# Patient Record
Sex: Female | Born: 2000 | Race: Asian | Hispanic: No | Marital: Single | Smoking: Never smoker
Health system: Southern US, Community
[De-identification: ages and names within clinical notes are randomized; demographics above are authoritative.]

## PROBLEM LIST (undated history)

## (undated) DIAGNOSIS — F988 Other specified behavioral and emotional disorders with onset usually occurring in childhood and adolescence: Secondary | ICD-10-CM

## (undated) DIAGNOSIS — F32A Depression, unspecified: Secondary | ICD-10-CM

## (undated) HISTORY — DX: Depression, unspecified: F32.A

## (undated) HISTORY — DX: Other specified behavioral and emotional disorders with onset usually occurring in childhood and adolescence: F98.8

---
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
## 2019-02-08 DIAGNOSIS — Z13220 Encounter for screening for lipoid disorders: Secondary | ICD-10-CM | POA: Diagnosis not present

## 2019-02-08 DIAGNOSIS — Z1331 Encounter for screening for depression: Secondary | ICD-10-CM | POA: Diagnosis not present

## 2019-02-08 DIAGNOSIS — N946 Dysmenorrhea, unspecified: Secondary | ICD-10-CM | POA: Diagnosis not present

## 2019-02-08 DIAGNOSIS — Z713 Dietary counseling and surveillance: Secondary | ICD-10-CM | POA: Diagnosis not present

## 2019-02-08 DIAGNOSIS — N926 Irregular menstruation, unspecified: Secondary | ICD-10-CM | POA: Diagnosis not present

## 2019-02-08 DIAGNOSIS — Z68.41 Body mass index (BMI) pediatric, less than 5th percentile for age: Secondary | ICD-10-CM | POA: Diagnosis not present

## 2019-02-08 DIAGNOSIS — Z0001 Encounter for general adult medical examination with abnormal findings: Secondary | ICD-10-CM | POA: Diagnosis not present

## 2019-02-08 DIAGNOSIS — Z113 Encounter for screening for infections with a predominantly sexual mode of transmission: Secondary | ICD-10-CM | POA: Diagnosis not present

## 2019-02-14 DIAGNOSIS — Z30011 Encounter for initial prescription of contraceptive pills: Secondary | ICD-10-CM | POA: Diagnosis not present

## 2019-02-14 DIAGNOSIS — F845 Asperger's syndrome: Secondary | ICD-10-CM | POA: Insufficient documentation

## 2019-02-14 DIAGNOSIS — N926 Irregular menstruation, unspecified: Secondary | ICD-10-CM | POA: Diagnosis not present

## 2019-06-26 DIAGNOSIS — N920 Excessive and frequent menstruation with regular cycle: Secondary | ICD-10-CM | POA: Diagnosis not present

## 2019-12-05 DIAGNOSIS — Z113 Encounter for screening for infections with a predominantly sexual mode of transmission: Secondary | ICD-10-CM | POA: Diagnosis not present

## 2019-12-05 DIAGNOSIS — N926 Irregular menstruation, unspecified: Secondary | ICD-10-CM | POA: Diagnosis not present

## 2019-12-05 DIAGNOSIS — Z01419 Encounter for gynecological examination (general) (routine) without abnormal findings: Secondary | ICD-10-CM | POA: Diagnosis not present

## 2019-12-05 DIAGNOSIS — Z681 Body mass index (BMI) 19 or less, adult: Secondary | ICD-10-CM | POA: Diagnosis not present

## 2021-01-16 ENCOUNTER — Ambulatory Visit (INDEPENDENT_AMBULATORY_CARE_PROVIDER_SITE_OTHER): Payer: 59 | Admitting: Podiatry

## 2021-01-16 ENCOUNTER — Other Ambulatory Visit: Payer: Self-pay

## 2021-01-16 ENCOUNTER — Ambulatory Visit (INDEPENDENT_AMBULATORY_CARE_PROVIDER_SITE_OTHER): Payer: 59

## 2021-01-16 DIAGNOSIS — M2012 Hallux valgus (acquired), left foot: Secondary | ICD-10-CM | POA: Diagnosis not present

## 2021-01-16 DIAGNOSIS — M2011 Hallux valgus (acquired), right foot: Secondary | ICD-10-CM

## 2021-01-16 DIAGNOSIS — M357 Hypermobility syndrome: Secondary | ICD-10-CM | POA: Diagnosis not present

## 2021-01-16 DIAGNOSIS — M21621 Bunionette of right foot: Secondary | ICD-10-CM

## 2021-01-16 DIAGNOSIS — M79671 Pain in right foot: Secondary | ICD-10-CM

## 2021-01-16 DIAGNOSIS — M21622 Bunionette of left foot: Secondary | ICD-10-CM

## 2021-01-16 NOTE — Progress Notes (Signed)
  Subjective:  Patient ID: Casey Kirby, female    DOB: [birth year],  MRN: [id number]  Chief Complaint  Patient presents with   Toe Pain    Right 1st digit painful, injured in may 2022   Bunions    Discuss bunion correction surgery    20 y.o. female presents with the above complaint. History confirmed with patient. Has pain with all of her shoes at the big toe, with pain at the little toe area with some shoes. Has had more consistent pain since she was seen for this issue in 2018. Objective:  Physical Exam: warm, good capillary refill, no trophic changes or ulcerative lesions, normal DP and PT pulses, and normal sensory exam. Hallux valgus bilat  with gross 1st TMT hypermobility bilat. Tailor's bunion bilat. POP 1st MPJ with prominent medial eminence. POP 5th MPJ. 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  No images are attached to the encounter.  Radiographs: X-ray of both feet: no fracture, dislocation, swelling or degenerative changes noted and severe HAV deformity bilat, tailor's bunionecctomy bilat with bowing of 5th metatarsal bilat Assessment:   1. Acquired hallux valgus of both feet   2. Foot joint hypermobility   3. Tailor's bunion of both feet    Plan:  Patient was evaluated and treated and all questions answered.  Bunion -XR reviewed with patient -Educated on etiology of deformity -Discussed proper shoe gear modifications and padding -Patient has failed all conservative therapy and wishes to proceed with surgical intervention. All risks, benefits, and alternatives discussed with patient. No guarantees given. Consent reviewed and signed by patient. -Planned procedures: right lapidus bunionectomy, tailor's bunionectomy -ASA 1 - Normal health patient  -Post-op anticoagulation: chemoprophylaxis not indicated -DME dispensed for post-op use: none  No follow-ups on file.

## 2021-01-21 ENCOUNTER — Other Ambulatory Visit: Payer: Self-pay | Admitting: Podiatry

## 2021-01-21 DIAGNOSIS — M2011 Hallux valgus (acquired), right foot: Secondary | ICD-10-CM

## 2021-02-04 ENCOUNTER — Telehealth: Payer: Self-pay | Admitting: Urology

## 2021-02-04 NOTE — Telephone Encounter (Signed)
DOS - 02/18/21  LAPIDUS PROC. INCLUDING BUNIONECTOMY RIGHT --- 28297 METATARSAL OSTEO 5TH RIGHT --- [phone]   [facility] EFFECTIVE DATE - 07/12/20   PLAN DEDUCTIBLE - $500.00 W/ $500.00 REMAINING OUT OF POCKET - $3,000.00 W/ $2,970.00 REMAINING COINSURANCE - 15% COPAY - $0.00    PER [facility] SITE CPT CODES 90903 AND 28308 HAVE BEEN APPROVED, AUTH # [id number].

## 2021-02-18 ENCOUNTER — Other Ambulatory Visit: Payer: Self-pay | Admitting: Podiatry

## 2021-02-18 ENCOUNTER — Encounter: Payer: Self-pay | Admitting: Podiatry

## 2021-02-18 DIAGNOSIS — M2011 Hallux valgus (acquired), right foot: Secondary | ICD-10-CM | POA: Diagnosis not present

## 2021-02-18 HISTORY — PX: OTHER SURGICAL HISTORY: SHX169

## 2021-02-18 MED ORDER — CEPHALEXIN 500 MG PO CAPS: ORAL_CAPSULE | ORAL | 0 refills | Status: DC

## 2021-02-18 MED ORDER — OXYCODONE-ACETAMINOPHEN 5-325 MG PO TABS: 1.0000 | ORAL_TABLET | ORAL | 0 refills | Status: DC | PRN

## 2021-02-19 ENCOUNTER — Telehealth: Payer: Self-pay | Admitting: Podiatry

## 2021-02-19 MED ORDER — OXYCODONE-ACETAMINOPHEN 10-325 MG PO TABS: 1.0000 | ORAL_TABLET | ORAL | 0 refills | Status: DC | PRN

## 2021-02-19 NOTE — Telephone Encounter (Signed)
Patients mom calling to inform Dr Samuella Cota that patient is not responding to pain medication. She called the anesthesiologist and they told her she would need to call the office to either have the medication changed or up the dosage. Please advise.

## 2021-02-19 NOTE — Telephone Encounter (Signed)
Patients mother called stating she had surgery yesterday and her pain medication is not working and wants to change the prescription.

## 2021-02-19 NOTE — Telephone Encounter (Signed)
Called mother and advised on pain med regimen including new rx, advised icing, elevating

## 2021-02-19 NOTE — Addendum Note (Signed)
Addended by: Ventura Sellers on: 02/19/2021 11:16 AM   Modules accepted: Orders

## 2021-02-24 ENCOUNTER — Ambulatory Visit (INDEPENDENT_AMBULATORY_CARE_PROVIDER_SITE_OTHER): Payer: 59

## 2021-02-24 ENCOUNTER — Other Ambulatory Visit: Payer: Self-pay

## 2021-02-24 ENCOUNTER — Ambulatory Visit (INDEPENDENT_AMBULATORY_CARE_PROVIDER_SITE_OTHER): Payer: 59 | Admitting: Podiatry

## 2021-02-24 DIAGNOSIS — Z9889 Other specified postprocedural states: Secondary | ICD-10-CM

## 2021-02-24 MED ORDER — ONDANSETRON HCL 4 MG PO TABS: 4.0000 mg | ORAL_TABLET | Freq: Three times a day (TID) | ORAL | 0 refills | Status: DC | PRN

## 2021-02-24 NOTE — Progress Notes (Signed)
  Subjective:  Patient ID: Casey Kirby, female    DOB: [birth year],  MRN: [id number]  Chief Complaint  Patient presents with   Routine Post Op    POV #1 DOS 02/18/2021 RT FOOT CORRECTION OF BUNION W/TARSOMETATARSAL FUSION, CORRECTION OF TAILORS BUNION. Pt states she is having a lot of pain with nausea.     DOS: 02/18/21 Procedure: Lapidus bunioctomy right, tailor's bunionectomy right   20 y.o. female presents with the above complaint. History confirmed with patient. Having a lot of nausea and pain post-operatively. States she is trying not to take the pain medication.  Objective:  Physical Exam: tenderness at the surgical site, local edema noted, and calf supple, nontender. Incision: healing well, no significant drainage, no dehiscence, no significant erythema  No images are attached to the encounter.  Radiographs: X-ray of the right foot: consistent with post-op state, with good alignment and no evidence of hardware complication.    Assessment:   1. Post-operative state     Plan:  Patient was evaluated and treated and all questions answered.  Post-operative State -XR reviewed with patient -Dressing applied consisting of sterile gauze, kerlix, and ACE bandage -NWB with crutches. -Pain has been difficult to control. Encouraged pain medication as needed. Rx Zofran for post-op and pain medication related nausea. -XRs needed at follow-up: none  Return in about 1 week (around 03/03/2021) for Post-Op (No XRs).

## 2021-03-03 ENCOUNTER — Ambulatory Visit (INDEPENDENT_AMBULATORY_CARE_PROVIDER_SITE_OTHER): Payer: 59 | Admitting: Podiatry

## 2021-03-03 ENCOUNTER — Other Ambulatory Visit: Payer: Self-pay

## 2021-03-03 DIAGNOSIS — Z9889 Other specified postprocedural states: Secondary | ICD-10-CM

## 2021-03-03 DIAGNOSIS — M2012 Hallux valgus (acquired), left foot: Secondary | ICD-10-CM

## 2021-03-03 DIAGNOSIS — M2011 Hallux valgus (acquired), right foot: Secondary | ICD-10-CM

## 2021-03-03 NOTE — Progress Notes (Signed)
  Subjective:  Patient ID: Casey Kirby, female    DOB: 26-Jan-2001,  MRN: [id number]  Chief Complaint  Patient presents with   Routine Post Op    PT stated that she is doing okay she does have some pain and itching    DOS: 02/18/21 Procedure: Lapidus bunioctomy right, tailor's bunionectomy right   20 y.o. female presents with the above complaint. History confirmed with patient. Pain much bette controlled at this point more itching than pain.  Objective:  Physical Exam: tenderness at the surgical site, local edema noted, and calf supple, nontender. Incision: healing well, no significant drainage, no dehiscence, no significant erythema  Assessment:   1. Post-operative state   2. Acquired hallux valgus of both feet    Plan:  Patient was evaluated and treated and all questions answered.  Post-operative State -Steri-strips applied to the incision -Ok to start showering at this time. Advised they cannot soak. -NWB with crutches. -Pain much better today. No need for pain refill per patient. -XRs needed at follow-up: 3 view Foot  Return in about 2 weeks (around 03/17/2021) for Post-Op (with XRs).

## 2021-03-03 NOTE — Addendum Note (Signed)
Addended by: Ventura Sellers on: 03/03/2021 04:51 PM   Modules accepted: Orders

## 2021-03-10 ENCOUNTER — Encounter: Payer: 59 | Admitting: Podiatry

## 2021-03-17 ENCOUNTER — Other Ambulatory Visit: Payer: Self-pay

## 2021-03-17 ENCOUNTER — Ambulatory Visit (INDEPENDENT_AMBULATORY_CARE_PROVIDER_SITE_OTHER): Payer: 59 | Admitting: Podiatry

## 2021-03-17 ENCOUNTER — Ambulatory Visit (INDEPENDENT_AMBULATORY_CARE_PROVIDER_SITE_OTHER): Payer: 59

## 2021-03-17 DIAGNOSIS — M21621 Bunionette of right foot: Secondary | ICD-10-CM

## 2021-03-17 DIAGNOSIS — M2011 Hallux valgus (acquired), right foot: Secondary | ICD-10-CM

## 2021-03-17 DIAGNOSIS — M2012 Hallux valgus (acquired), left foot: Secondary | ICD-10-CM

## 2021-03-17 DIAGNOSIS — Z9889 Other specified postprocedural states: Secondary | ICD-10-CM

## 2021-03-17 DIAGNOSIS — M21622 Bunionette of left foot: Secondary | ICD-10-CM

## 2021-03-23 NOTE — Progress Notes (Signed)
  Subjective:  Patient ID: Casey Kirby, female    DOB: February 16, 2001,  MRN: [id number]  Chief Complaint  Patient presents with   Routine Post Op    POV #3 DOS 02/18/2021 RT FOOT CORRECTION OF BUNION W/TARSOMETATARSAL FUSION, CORRECTION OF TAILORS BUNION   DOS: 02/18/21 Procedure: Lapidus bunioctomy right, tailor's bunionectomy right   19 y.o. female presents with the above complaint. History confirmed with patient.  Has occasional sharp pains in complaint that there is a small area of incision that she thinks might be open.  Objective:  Physical Exam: tenderness at the surgical site, local edema noted, and calf supple, nontender. Incision: Healing well but for small 0.3 x 0.1 area of delayed healing  Assessment:   1. Post-operative state   2. Acquired hallux valgus of both feet   3. Tailor's bunion of both feet    Plan:  Patient was evaluated and treated and all questions answered.  Post-operative State -Repeat x-rays show progressive bridging of the arthrodesis site and 5th met osteotomy -Okay to start protected weightbearing in the boot. -Apply povidone Telfa and dry dressing today.  Patient to apply povidone and gauze tape daily for continued small area of delayed healing. -No need for pain medication refill today -XRs needed at follow-up: 3 view Foot  No follow-ups on file.

## 2021-03-31 ENCOUNTER — Ambulatory Visit (INDEPENDENT_AMBULATORY_CARE_PROVIDER_SITE_OTHER): Payer: 59 | Admitting: Podiatry

## 2021-03-31 ENCOUNTER — Ambulatory Visit (INDEPENDENT_AMBULATORY_CARE_PROVIDER_SITE_OTHER): Payer: 59

## 2021-03-31 ENCOUNTER — Other Ambulatory Visit: Payer: Self-pay

## 2021-03-31 DIAGNOSIS — Z9889 Other specified postprocedural states: Secondary | ICD-10-CM

## 2021-03-31 DIAGNOSIS — M2012 Hallux valgus (acquired), left foot: Secondary | ICD-10-CM | POA: Diagnosis not present

## 2021-03-31 DIAGNOSIS — M2011 Hallux valgus (acquired), right foot: Secondary | ICD-10-CM

## 2021-03-31 NOTE — Progress Notes (Signed)
  Subjective:  Patient ID: Casey Kirby, female    DOB: [birth year],  MRN: [id number]  Chief Complaint  Patient presents with   Routine Post Op     POV #4 DOS 02/18/2021 RT FOOT CORRECTION OF BUNION W/TARSOMETATARSAL FUSION, CORRECTION OF TAILORS BUNION   DOS: 02/18/21 Procedure: Lapidus bunioctomy right, tailor's bunionectomy right   20 y.o. female presents with the above complaint. History confirmed with patient. Doing well not having much pain or discomfort. Has been wearing the boot without issues.  Objective:  Physical Exam: tenderness at the surgical site, local edema noted, and calf supple, nontender. Incision: Healing well, fully healed with dried dermabond noted around the incisions.  Assessment:   1. Acquired hallux valgus of both feet   2. Post-operative state    Plan:  Patient was evaluated and treated and all questions answered.  Post-operative State -Repeat x-rays show healing of the arthrodesis and osteotomy. Progress WB to surgical shoe. This was dispensed today. Wear for 2 weeks, then normal shoegear. -F/u in 1 month for recheck -XRs needed at follow-up: 3 view Foot  Return in about 1 month (around 04/30/2021).

## 2021-05-05 ENCOUNTER — Ambulatory Visit (INDEPENDENT_AMBULATORY_CARE_PROVIDER_SITE_OTHER): Payer: 59 | Admitting: Podiatry

## 2021-05-05 ENCOUNTER — Other Ambulatory Visit: Payer: Self-pay

## 2021-05-05 ENCOUNTER — Ambulatory Visit (INDEPENDENT_AMBULATORY_CARE_PROVIDER_SITE_OTHER): Payer: 59

## 2021-05-05 DIAGNOSIS — Z9889 Other specified postprocedural states: Secondary | ICD-10-CM | POA: Diagnosis not present

## 2021-05-05 NOTE — Progress Notes (Signed)
  Subjective:  Patient ID: Casey Kirby, female    DOB: 2000/12/19,  MRN: [id number]  Chief Complaint  Patient presents with   Routine Post Op    POV #5 DOS 02/18/2021 RT FOOT CORRECTION OF BUNION W/TARSOMETATARSAL FUSION, CORRECTION OF TAILORS BUNION. Pt states she is gradually improving. Not quite 100%.    DOS: 02/18/21 Procedure: Lapidus bunioctomy right, tailor's bunionectomy right   20 y.o. female presents with the above complaint. History confirmed with patient. Doing very well states she is able to wear most shoes but some are still not comfortable.  Objective:  Physical Exam: no tenderness at the surgical site, no edema noted, and calf supple, nontender. Decreased ROM 1st MPJ, reducible on manual stretch Incision: fully healed  Assessment:   1. Post-operative state    Plan:  Patient was evaluated and treated and all questions answered.  Post-operative State -Repeat x-rays show full healing of the arthrodesis and osteotomy.  -Continue WBAT in normal shoegear -F/u in 1 month for recheck -XRs needed at follow-up: none  No follow-ups on file.

## 2021-06-02 ENCOUNTER — Other Ambulatory Visit: Payer: Self-pay

## 2021-06-02 ENCOUNTER — Ambulatory Visit (INDEPENDENT_AMBULATORY_CARE_PROVIDER_SITE_OTHER): Payer: 59 | Admitting: Podiatry

## 2021-06-02 ENCOUNTER — Ambulatory Visit: Payer: Self-pay | Admitting: Podiatry

## 2021-06-02 DIAGNOSIS — M2011 Hallux valgus (acquired), right foot: Secondary | ICD-10-CM

## 2021-06-02 DIAGNOSIS — M357 Hypermobility syndrome: Secondary | ICD-10-CM

## 2021-06-02 DIAGNOSIS — M21621 Bunionette of right foot: Secondary | ICD-10-CM

## 2021-06-02 DIAGNOSIS — M2012 Hallux valgus (acquired), left foot: Secondary | ICD-10-CM

## 2021-06-02 DIAGNOSIS — M21622 Bunionette of left foot: Secondary | ICD-10-CM

## 2021-06-02 NOTE — Progress Notes (Signed)
  Subjective:  Patient ID: Casey Kirby, female    DOB: 05-26-2001,  MRN: [id number]  Chief Complaint  Patient presents with   Routine Post Op     POV #6 DOS 02/18/2021 RT FOOT CORRECTION OF BUNION W/TARSOMETATARSAL FUSION, CORRECTION OF TAILORS BUNION. PT states she has been doing well. No questions or concerns.    DOS: 02/18/21 Procedure: Lapidus bunioctomy right, tailor's bunionectomy right   20 y.o. female presents with the above complaint. History confirmed with patient. Denies pain or issues right foot but having pain and inability to wear certain shoes on her left foot. She would like to proceed with surgery as previously discussed.  Objective:  Physical Exam: no tenderness at the surgical site, no edema noted, and calf supple, nontender. Near full ROM 1st MPJ, Incision: fully healed]  Severe Left foot HAV, with 1st TMT gross hypermobility. POP 1st/5th MPJs.  Radiographs 7/8: X-ray of left foot: no fracture, dislocation, swelling or degenerative changes noted and severe HAV deformity , tailor's bunionecctomy with bowing of 5th metatarsal bilat Assessment:   1. Acquired hallux valgus of both feet   2. Tailor's bunion of both feet   3. Foot joint hypermobility    Plan:  Patient was evaluated and treated and all questions answered.  Post-operative State -Well healed no pain good positioning and good ROM  Bunion -Previous XR reviewed. Severe HAV, tailor's bunion deformity. -Given pain and difficulty with shoegear on the left side (and right side now resolved post-operatively), will proceed with left foot surgery as previously discussed. -Patient has failed conservative therapy and wishes to proceed with surgical intervention. All risks, benefits, and alternatives discussed with patient. No guarantees given. Consent reviewed and signed by patient. -Planned procedures: Lapidus bunionectomy, tailor's bunionectomy with osteotomy, possible Akin osteotomy   No follow-ups on file.

## 2021-06-10 ENCOUNTER — Telehealth: Payer: Self-pay | Admitting: Urology

## 2021-06-10 NOTE — Telephone Encounter (Signed)
DOS - 06/17/21  Quintella Reichert OSTEOTOMY LEFT --- 41937 LAPIDUS PROCEDURE INCLUDING BUNIONECTOMY LEFT --- 28297 METATARSAL OSTEOTOMY 5TH LEFT --- 28308   [facility] EFFECTIVE DATE - 07/12/20  RECEIVED FAX FROM [facility] STATING THAT CPT CODES 90240, 97353 AND 28308 HAVE BEEN APPROVED, AUTH # [id number], GOOD FROM 06/17/21 - 09/15/21.

## 2021-06-17 ENCOUNTER — Encounter (HOSPITAL_BASED_OUTPATIENT_CLINIC_OR_DEPARTMENT_OTHER): Payer: Self-pay | Admitting: Podiatry

## 2021-06-17 NOTE — Progress Notes (Signed)
Spoke with shelley and patient is having surgery today at another location this surgery to be cancelled

## 2021-06-19 ENCOUNTER — Ambulatory Visit (HOSPITAL_BASED_OUTPATIENT_CLINIC_OR_DEPARTMENT_OTHER): Admission: RE | Admit: 2021-06-19 | Payer: 59 | Source: Home / Self Care | Admitting: Podiatry

## 2021-06-19 SURGERY — BUNIONECTOMY, LAPIDUS
Anesthesia: General | Laterality: Left

## 2021-06-22 ENCOUNTER — Telehealth: Payer: Self-pay | Admitting: Podiatry

## 2021-06-22 NOTE — Telephone Encounter (Signed)
Patient called and stated that her sx was cancelled and asked that her post op be cancelled.

## 2021-06-23 ENCOUNTER — Encounter: Payer: 59 | Admitting: Podiatry

## 2021-07-07 ENCOUNTER — Encounter: Payer: 59 | Admitting: Podiatry

## 2021-08-20 ENCOUNTER — Ambulatory Visit: Payer: 59 | Admitting: Registered Nurse

## 2021-09-03 ENCOUNTER — Ambulatory Visit: Payer: 59 | Admitting: Registered Nurse

## 2021-09-03 ENCOUNTER — Encounter: Payer: Self-pay | Admitting: Registered Nurse

## 2021-09-03 ENCOUNTER — Other Ambulatory Visit: Payer: Self-pay

## 2021-09-03 ENCOUNTER — Other Ambulatory Visit (HOSPITAL_COMMUNITY)
Admission: RE | Admit: 2021-09-03 | Discharge: 2021-09-03 | Disposition: A | Discharge status: Home / Self Care | Payer: 59 | Source: Ambulatory Visit | Attending: Registered Nurse | Admitting: Registered Nurse

## 2021-09-03 VITALS — BP 98/79 | HR 81 | Temp 97.8°F | Resp 16 | Ht 66.0 in | Wt 101.8 lb

## 2021-09-03 DIAGNOSIS — H8110 Benign paroxysmal vertigo, unspecified ear: Secondary | ICD-10-CM

## 2021-09-03 DIAGNOSIS — Z1159 Encounter for screening for other viral diseases: Secondary | ICD-10-CM | POA: Diagnosis not present

## 2021-09-03 DIAGNOSIS — Z789 Other specified health status: Secondary | ICD-10-CM | POA: Insufficient documentation

## 2021-09-03 DIAGNOSIS — R1909 Other intra-abdominal and pelvic swelling, mass and lump: Secondary | ICD-10-CM

## 2021-09-03 DIAGNOSIS — Z5320 Procedure and treatment not carried out because of patient's decision for unspecified reasons: Secondary | ICD-10-CM

## 2021-09-03 DIAGNOSIS — R55 Syncope and collapse: Secondary | ICD-10-CM

## 2021-09-03 LAB — CBC WITH DIFFERENTIAL/PLATELET
Basophils Absolute: 0 10*3/uL (ref 0.0–0.1)
Basophils Relative: 0.6 % (ref 0.0–3.0)
Eosinophils Absolute: 0.1 10*3/uL (ref 0.0–0.7)
Eosinophils Relative: 2 % (ref 0.0–5.0)
HCT: 43 % (ref 36.0–46.0)
Hemoglobin: 14 g/dL (ref 12.0–15.0)
Lymphocytes Relative: 43 % (ref 12.0–46.0)
Lymphs Abs: 1.8 10*3/uL (ref 0.7–4.0)
MCHC: 32.5 g/dL (ref 30.0–36.0)
MCV: 91.7 fl (ref 78.0–100.0)
Monocytes Absolute: 0.2 10*3/uL (ref 0.1–1.0)
Monocytes Relative: 5.4 % (ref 3.0–12.0)
Neutro Abs: 2 10*3/uL (ref 1.4–7.7)
Neutrophils Relative %: 49 % (ref 43.0–77.0)
Platelets: 218 10*3/uL (ref 150.0–400.0)
RBC: 4.69 Mil/uL (ref 3.87–5.11)
RDW: 12.5 % (ref 11.5–15.5)
WBC: 4.1 10*3/uL (ref 4.0–10.5)

## 2021-09-03 LAB — TSH: TSH: 0.59 u[IU]/mL (ref 0.35–5.50)

## 2021-09-03 LAB — COMPREHENSIVE METABOLIC PANEL
ALT: 7 U/L (ref 0–35)
AST: 12 U/L (ref 0–37)
Albumin: 4.7 g/dL (ref 3.5–5.2)
Alkaline Phosphatase: 42 U/L (ref 39–117)
BUN: 10 mg/dL (ref 6–23)
CO2: 28 mEq/L (ref 19–32)
Calcium: 9.5 mg/dL (ref 8.4–10.5)
Chloride: 104 mEq/L (ref 96–112)
Creatinine, Ser: 0.67 mg/dL (ref 0.40–1.20)
GFR: 124.67 mL/min (ref >60.00)
Glucose, Bld: 79 mg/dL (ref 70–99)
Potassium: 3.8 mEq/L (ref 3.5–5.1)
Sodium: 139 mEq/L (ref 135–145)
Total Bilirubin: 0.9 mg/dL (ref 0.2–1.2)
Total Protein: 7.1 g/dL (ref 6.0–8.3)

## 2021-09-03 LAB — POCT URINE PREGNANCY: Preg Test, Ur: NEGATIVE

## 2021-09-03 MED ORDER — MECLIZINE HCL 12.5 MG PO TABS: 12.5000 mg | ORAL_TABLET | Freq: Three times a day (TID) | ORAL | 0 refills | Status: DC | PRN

## 2021-09-03 MED ORDER — XULANE 150-35 MCG/24HR TD PTWK: MEDICATED_PATCH | TRANSDERMAL | 4 refills | Status: DC

## 2021-09-03 NOTE — Patient Instructions (Addendum)
Ms. Casey Kirby to meet you!  I have placed a referral to Gynecology. They will call you to set up an appointment. We'll make sure the office we send you to will take your insurance.  I have ordered labs today. I will let you know how results look. If you sign up for MyChart, you will be able to see the results there.   I think it's likely that dehydration and low blood sugars are the largest contributors to how you are feeling. I recommend making sure you snack throughout the [place] and drink at least 2L of water daily.   Come back in for a physical and repeat blood work (if needed) within a year or so.  Thank you,  Rich     If you have lab work done today you will be contacted with your lab results within the next 2 weeks.  If you have not heard from [place] then please contact us. The fastest way to get your results is to register for My Chart.   IF you received an x-ray today, you will receive an invoice from [facility]. Please contact [facility] at [phone] with questions or concerns regarding your invoice.   IF you received labwork today, you will receive an invoice from [facility]. Please contact [facility] at [phone] with questions or concerns regarding your invoice.   Our billing staff will not be able to assist you with questions regarding bills from these companies.  You will be contacted with the lab results as soon as they are available. The fastest way to get your results is to activate your My Chart account. Instructions are located on the last page of this paperwork. If you have not heard from [place] regarding the results in 2 weeks, please contact this office.

## 2021-09-03 NOTE — Progress Notes (Signed)
New Patient Office Visit  Subjective:  Patient ID: Casey Kirby, female    DOB: 06-11-2001  Age: 21 y.o. MRN: [id number]  CC:  Chief Complaint  Patient presents with   New Patient (Initial Visit)    Patient states she is here to establish care. Patient states she wants to discuss birth control and possible vertigo.    HPI Casey Kirby presents for visit to est care, birth control, vertigo  Birth Control Used patch in past would like to resume No UPIC since last menses Notes infrequent menses when not on BCM Intermittent sexual activity - every few months   Vertigo  Dizziness with changes in position, rapid changes in direction. Onset in middle school with episode of syncope in middle school Episodes of syncope a few times each year Unsure of cause. Does not note any chest pain, palpitations, nvd, incontinence, clamminess when these occur. Events last 15 minutes before return to normal. No fam hx of early cardiac events.  Lump Groin Defers on assessment today. Would feel more comfortable with female provider. She is interested in referral to [facility] for this and pap.  Otherwise doing well, no other concerns  Histories reviewed and updated with patient.    History reviewed. No pertinent past medical history.  Past Surgical History:  Procedure Laterality Date   right foot bunion surgery  02/18/2021    Family History  Problem Relation Age of Onset   Gout Father     Social History   Socioeconomic History   Marital status: Single    Spouse name: Not on file   Number of children: 0   Years of education: Not on file   Highest education level: Not on file  Occupational History   Occupation: student    Comment: [employer]  Tobacco Use   Smoking status: Never   Smokeless tobacco: Not on file  Vaping Use   Vaping Use: Never used  Substance and Sexual Activity   Alcohol use: Yes    Comment: soc/occ   Drug use: No   Sexual activity: Yes    Partners: Male    Birth  control/protection: None  Other Topics Concern   Not on file  Social History Narrative   Not on file   Social Determinants of Health   Financial Resource Strain: Not on file  Food Insecurity: Not on file  Transportation Needs: Not on file  Physical Activity: Not on file  Stress: Not on file  Social Connections: Not on file  Intimate Partner Violence: Not on file    ROS Review of Systems Per hpi   Objective:   Today's Vitals: BP 98/79    Pulse 81    Temp 97.8 F (36.6 C) (Temporal)    Resp 16    Ht 5\' 6"  (1.676 m)    Wt 101 lb 12.8 oz (46.2 kg)    SpO2 98%    BMI 16.43 kg/m   Physical Exam Vitals and nursing note reviewed.  Constitutional:      General: She is not in acute distress.    Appearance: Normal appearance. She is not ill-appearing, toxic-appearing or diaphoretic.  Cardiovascular:     Rate and Rhythm: Normal rate and regular rhythm.     Pulses: Normal pulses.     Heart sounds: Normal heart sounds. No murmur heard.   No friction rub. No gallop.  Pulmonary:     Effort: Pulmonary effort is normal. No respiratory distress.     Breath sounds: Normal breath sounds.  No stridor. No wheezing, rhonchi or rales.  Chest:     Chest wall: No tenderness.  Skin:    General: Skin is warm and dry.     Capillary Refill: Capillary refill takes less than 2 seconds.  Neurological:     General: No focal deficit present.     Mental Status: She is alert and oriented to person, place, and time. Mental status is at baseline.     Cranial Nerves: No cranial nerve deficit.     Sensory: No sensory deficit.     Motor: No weakness.     Coordination: Coordination normal.     Gait: Gait normal.  Psychiatric:        Mood and Affect: Mood normal.        Behavior: Behavior normal.        Thought Content: Thought content normal.        Judgment: Judgment normal.    Assessment & Plan:   Problem List Items Addressed This Visit   None Visit Diagnoses     Uses hormonal contraceptive  patch as primary birth control method    -  Primary   Relevant Medications   norelgestromin-ethinyl estradiol [employer]) 150-35 MCG/24HR transdermal patch   Other Relevant Orders   POCT Pregnancy, Urine   POCT urine pregnancy (Completed)   Urine cytology ancillary only([place])   Syncope, unspecified syncope type       Relevant Medications   meclizine (ANTIVERT) 12.5 MG tablet   Other Relevant Orders   CBC with Differential/Platelet   Comprehensive metabolic panel   TSH   Iron, TIBC and Ferritin Panel   Benign paroxysmal positional vertigo, unspecified laterality       Relevant Medications   meclizine (ANTIVERT) 12.5 MG tablet   Other Relevant Orders   CBC with Differential/Platelet   Comprehensive metabolic panel   TSH   Iron, TIBC and Ferritin Panel   Encounter for screening for other viral diseases       Relevant Orders   HIV antibody (with reflex)   Lump in the groin       Relevant Orders   Ambulatory referral to Gynecology   Papanicolaou smear declined       Relevant Orders   Ambulatory referral to Gynecology       Outpatient Encounter Medications as of 09/03/2021  Medication Sig   amphetamine-dextroamphetamine (ADDERALL) 5 MG tablet Take 5 mg daily by mouth. Pt stated, "I take as needed"   meclizine (ANTIVERT) 12.5 MG tablet Take 1 tablet (12.5 mg total) by mouth 3 (three) times daily as needed for dizziness.   norelgestromin-ethinyl estradiol [employer]) 150-35 MCG/24HR transdermal patch Xulane 150 mcg-35 mcg/24 hr transdermal patch  APPLY 1 PATCH EVERY WEEK BY TRANSDERMAL ROUTE AS DIRECTED.   [DISCONTINUED] cephALEXin (KEFLEX) 500 MG capsule Take 1 tablet by mouth tonight, then one tomorrow AM and one in PM (Patient not taking: Reported on 09/03/2021)   [DISCONTINUED] norelgestromin-ethinyl estradiol [employer]) 150-35 MCG/24HR transdermal patch Xulane 150 mcg-35 mcg/24 hr transdermal patch  APPLY 1 PATCH EVERY WEEK BY TRANSDERMAL ROUTE AS DIRECTED. (Patient not taking:  Reported on 09/03/2021)   [DISCONTINUED] ondansetron (ZOFRAN) 4 MG tablet Take 1 tablet (4 mg total) by mouth every 8 (eight) hours as needed for nausea or vomiting. (Patient not taking: Reported on 09/03/2021)   [DISCONTINUED] oxyCODONE-acetaminophen (PERCOCET) 10-325 MG tablet Take 1 tablet by mouth every 4 (four) hours as needed for pain. (Patient not taking: Reported on 09/03/2021)   No facility-administered encounter  medications on file as of 09/03/2021.    Follow-up: Return in about 1 year (around 09/03/2022) for CPE, labs.   PLAN Exam unremarkable. Will collect labs. Suspect dehydration and hypoglycemia largest contributors to syncopal episodes. Discussed this with patient who voices understanding Do think there is an aspect of vertigo going on. Will trial meclizine.  Refer to gynecology for groin lump and pap. Return for CPE and repeat labs at pt convenience. Patient encouraged to call clinic with any questions, comments, or concerns.  Janeece Agee, NP

## 2021-09-04 ENCOUNTER — Encounter: Payer: Self-pay | Admitting: Registered Nurse

## 2021-09-04 LAB — URINE CYTOLOGY ANCILLARY ONLY
Chlamydia: NEGATIVE
Comment: NEGATIVE
Comment: NEGATIVE
Comment: NORMAL
Neisseria Gonorrhea: NEGATIVE
Trichomonas: NEGATIVE

## 2021-09-07 ENCOUNTER — Telehealth: Payer: Self-pay | Admitting: Infectious Disease

## 2021-09-07 NOTE — Telephone Encounter (Signed)
Was alerted to HIV + 4th gen test, discrim assay was negatie so this is either acute HIV or a false + ab test. I cannot see that an HIV RNA qualitatie is pending. We would be happy to clarify with HIV quant or see pt certainlyh for HIF treatment (if she is +) or prevention (PrEP)

## 2021-09-08 ENCOUNTER — Other Ambulatory Visit: Payer: Self-pay | Admitting: Registered Nurse

## 2021-09-08 ENCOUNTER — Other Ambulatory Visit: Payer: 59

## 2021-09-08 DIAGNOSIS — Z1159 Encounter for screening for other viral diseases: Secondary | ICD-10-CM

## 2021-09-08 NOTE — Progress Notes (Signed)
HIV Ab repeatedly positive. Differentiation shows negative  Either early infection or false positive.   Called patient to discuss results. Identity verified with DOB and address.  Discussed findings with her. We will repeat testing in office today.   If positive, refer to ID.  She voices understanding of plan.   Questions answered, concerns addressed    Kathrin Ruddy, NP

## 2021-09-08 NOTE — Progress Notes (Signed)
Called patient and discussed results. Will repeat Ab testing today in our office and refer to ID if warranted. She voices understanding of plan  Thanks,  Jari Sportsman, NP

## 2021-09-09 LAB — IRON,TIBC AND FERRITIN PANEL
%SAT: 37 % (calc) (ref 16–45)
Ferritin: 27 ng/mL (ref 16–154)
Iron: 110 ug/dL (ref 40–190)
TIBC: 301 mcg/dL (calc) (ref 250–450)

## 2021-09-09 LAB — HIV-1/2 AB - DIFFERENTIATION
HIV-1 antibody: NEGATIVE
HIV-2 Ab: NEGATIVE

## 2021-09-09 LAB — HIV 1 RNA, QL RT PCR: HIV-1 RNA, Qualitative, TMA: NOT DETECTED

## 2021-09-09 LAB — HIV ANTIBODY (ROUTINE TESTING W REFLEX)
HIV 1&2 Ab, 4th Generation: REACTIVE — AB
HIV 1&2 Ab, 4th Generation: NONREACTIVE

## 2021-09-09 NOTE — Telephone Encounter (Signed)
HIV viral load in process.  ? ?Sandie Ano, RN ? ?

## 2021-09-14 ENCOUNTER — Encounter: Payer: Self-pay | Admitting: Registered Nurse

## 2021-09-14 ENCOUNTER — Ambulatory Visit (INDEPENDENT_AMBULATORY_CARE_PROVIDER_SITE_OTHER): Payer: 59 | Admitting: Registered Nurse

## 2021-09-14 ENCOUNTER — Other Ambulatory Visit: Payer: Self-pay

## 2021-09-14 VITALS — BP 91/65 | HR 77 | Temp 98.0°F | Resp 18 | Ht 66.0 in | Wt 101.0 lb

## 2021-09-14 DIAGNOSIS — M79602 Pain in left arm: Secondary | ICD-10-CM | POA: Diagnosis not present

## 2021-09-14 MED ORDER — DOXYCYCLINE HYCLATE 100 MG PO TABS: 100.0000 mg | ORAL_TABLET | Freq: Two times a day (BID) | ORAL | 0 refills | Status: DC

## 2021-09-14 MED ORDER — DICLOFENAC SODIUM 75 MG PO TBEC: 75.0000 mg | DELAYED_RELEASE_TABLET | Freq: Two times a day (BID) | ORAL | 0 refills | Status: DC

## 2021-09-14 NOTE — Patient Instructions (Addendum)
Casey Kirby - ? ?Great to see you ? ?Call if things don't get better ? ?Take antibiotics (doxycycline) and antiinflammatories (diclofenac) with food.  ? ?Finish all of the antibiotics even if you're feeling better. ? ?If you're not feeling better by Thursday, let me know so we can set up the ultrasound for next week. ? ?If anything gets worse, let me know ? ?Thanks, ? ?Rich  ? ? ? ?If you have lab work done today you will be contacted with your lab results within the next 2 weeks.  If you have not heard from [place] then please contact us. The fastest way to get your results is to register for My Chart. ? ? ?IF you received an x-ray today, you will receive an invoice from [facility]. Please contact [facility] at [phone] with questions or concerns regarding your invoice.  ? ?IF you received labwork today, you will receive an invoice from [facility]. Please contact [facility] at [phone] with questions or concerns regarding your invoice.  ? ?Our billing staff will not be able to assist you with questions regarding bills from these companies. ? ?You will be contacted with the lab results as soon as they are available. The fastest way to get your results is to activate your My Chart account. Instructions are located on the last page of this paperwork. If you have not heard from [place] regarding the results in 2 weeks, please contact this office. ?  ? ? ?

## 2021-09-14 NOTE — Progress Notes (Signed)
? ?Established Patient Office Visit ? ?Subjective:  ?Patient ID: Casey Kirby, female    DOB: 28-Apr-2001  Age: 21 y.o. MRN: [id number] ? ?CC:  ?Chief Complaint  ?Patient presents with  ? Arm Pain  ?  Patient states she is having some shooting pain in left arm while trying to use it.  ? ? ?HPI ?Casey Kirby presents for arm pain ? ?Following blood draw last week ?Pain with supination and pronation of wrist ?Shooting pain from elbow to wrist ?No swelling, redness, or drainage ?No adenopathy ?No constitutional symptoms. ? ?History reviewed. No pertinent past medical history. ? ?Past Surgical History:  ?Procedure Laterality Date  ? right foot bunion surgery  02/18/2021  ? ? ?Family History  ?Problem Relation Age of Onset  ? Gout Father   ? ? ?Social History  ? ?Socioeconomic History  ? Marital status: Single  ?  Spouse name: Not on file  ? Number of children: 0  ? Years of education: Not on file  ? Highest education level: Not on file  ?Occupational History  ? Occupation: student  ?  Comment: [employer]  ?Tobacco Use  ? Smoking status: Never  ? Smokeless tobacco: Not on file  ?Vaping Use  ? Vaping Use: Never used  ?Substance and Sexual Activity  ? Alcohol use: Yes  ?  Comment: soc/occ  ? Drug use: No  ? Sexual activity: Yes  ?  Partners: Male  ?  Birth control/protection: None  ?Other Topics Concern  ? Not on file  ?Social History Narrative  ? Not on file  ? ?Social Determinants of Health  ? ?Financial Resource Strain: Not on file  ?Food Insecurity: Not on file  ?Transportation Needs: Not on file  ?Physical Activity: Not on file  ?Stress: Not on file  ?Social Connections: Not on file  ?Intimate Partner Violence: Not on file  ? ? ?Outpatient Medications Prior to Visit  ?Medication Sig Dispense Refill  ? amphetamine-dextroamphetamine (ADDERALL) 5 MG tablet Take 5 mg daily by mouth. Pt stated, "I take as needed"    ? meclizine (ANTIVERT) 12.5 MG tablet Take 1 tablet (12.5 mg total) by mouth 3 (three) times daily as needed for  dizziness. 30 tablet 0  ? norelgestromin-ethinyl estradiol [employer]) 150-35 MCG/24HR transdermal patch Xulane 150 mcg-35 mcg/24 hr transdermal patch  APPLY 1 PATCH EVERY WEEK BY TRANSDERMAL ROUTE AS DIRECTED. 9 patch 4  ? ?No facility-administered medications prior to visit.  ? ? ?Not on File ? ?ROS ?Review of Systems  ?Constitutional: Negative.   ?HENT: Negative.    ?Eyes: Negative.   ?Respiratory: Negative.    ?Cardiovascular: Negative.   ?Gastrointestinal: Negative.   ?Genitourinary: Negative.   ?Musculoskeletal:  Positive for arthralgias.  ?Skin: Negative.   ?Neurological: Negative.   ?Psychiatric/Behavioral: Negative.    ?All other systems reviewed and are negative. ? ?  ?Objective:  ?  ?Physical Exam ?Vitals and nursing note reviewed.  ?Constitutional:   ?   General: She is not in acute distress. ?   Appearance: Normal appearance. She is normal weight. She is not ill-appearing, toxic-appearing or diaphoretic.  ?Cardiovascular:  ?   Rate and Rhythm: Normal rate and regular rhythm.  ?   Heart sounds: Normal heart sounds. No murmur heard. ?  No friction rub. No gallop.  ?Pulmonary:  ?   Effort: Pulmonary effort is normal. No respiratory distress.  ?   Breath sounds: Normal breath sounds. No stridor. No wheezing, rhonchi or rales.  ?Chest:  ?  Chest wall: No tenderness.  ?Musculoskeletal:     ?   General: Tenderness (L antecubital) present. No swelling, deformity or signs of injury. Normal range of motion.  ?   Right lower leg: No edema.  ?   Left lower leg: No edema.  ?Skin: ?   General: Skin is warm and dry.  ?Neurological:  ?   General: No focal deficit present.  ?   Mental Status: She is alert and oriented to person, place, and time. Mental status is at baseline.  ?Psychiatric:     ?   Mood and Affect: Mood normal.     ?   Behavior: Behavior normal.     ?   Thought Content: Thought content normal.     ?   Judgment: Judgment normal.  ? ? ?BP 91/65   Pulse 77   Temp 98 ?F (36.7 ?C) (Temporal)   Resp 18   Ht  5\' 6"  (1.676 m)   Wt 101 lb (45.8 kg)   SpO2 96%   BMI 16.30 kg/m?  ?Wt Readings from Last 3 Encounters:  ?09/14/21 101 lb (45.8 kg)  ?09/03/21 101 lb 12.8 oz (46.2 kg)  ?06/21/14 100 lb (45.4 kg) (25 %, Z= -0.67)*  ? ?* Growth percentiles are based on CDC (Girls, 2-20 Years) data.  ? ? ? ?Health Maintenance Due  ?Topic Date Due  ? HPV VACCINES (1 - 2-dose series) Never done  ? Hepatitis C Screening  Never done  ? TETANUS/TDAP  Never done  ? PAP-Cervical Cytology Screening  Never done  ? PAP SMEAR-Modifier  Never done  ? INFLUENZA VACCINE  Never done  ? ? ?   ?Topic Date Due  ? HPV VACCINES (1 - 2-dose series) Never done  ? ? ?Lab Results  ?Component Value Date  ? TSH 0.59 09/03/2021  ? ?Lab Results  ?Component Value Date  ? WBC 4.1 09/03/2021  ? HGB 14.0 09/03/2021  ? HCT 43.0 09/03/2021  ? MCV 91.7 09/03/2021  ? PLT 218.0 09/03/2021  ? ?Lab Results  ?Component Value Date  ? NA 139 09/03/2021  ? K 3.8 09/03/2021  ? CO2 28 09/03/2021  ? GLUCOSE 79 09/03/2021  ? BUN 10 09/03/2021  ? CREATININE 0.67 09/03/2021  ? BILITOT 0.9 09/03/2021  ? ALKPHOS 42 09/03/2021  ? AST 12 09/03/2021  ? ALT 7 09/03/2021  ? PROT 7.1 09/03/2021  ? ALBUMIN 4.7 09/03/2021  ? CALCIUM 9.5 09/03/2021  ? GFR 124.67 09/03/2021  ? ?No results found for: CHOL ?No results found for: HDL ?No results found for: LDLCALC ?No results found for: TRIG ?No results found for: CHOLHDL ?No results found for: HGBA1C ? ?  ?Assessment & Plan:  ? ?Problem List Items Addressed This Visit   ?None ?Visit Diagnoses   ? ? Pain in left arm    -  Primary  ? Relevant Medications  ? doxycycline (VIBRA-TABS) 100 MG tablet  ? diclofenac (VOLTAREN) 75 MG EC tablet  ? ?  ? ? ?Meds ordered this encounter  ?Medications  ? doxycycline (VIBRA-TABS) 100 MG tablet  ?  Sig: Take 1 tablet (100 mg total) by mouth 2 (two) times daily.  ?  Dispense:  14 tablet  ?  Refill:  0  ?  Order Specific Question:   Supervising Provider  ?  Answer:   09/05/2021, JEFFREY R [[id number]]  ? diclofenac  (VOLTAREN) 75 MG EC tablet  ?  Sig: Take 1 tablet (75 mg total) by mouth 2 (  two) times daily.  ?  Dispense:  30 tablet  ?  Refill:  0  ?  Order Specific Question:   Supervising Provider  ?  Answer:   Neva Seat, JEFFREY R [[id number]]  ? ? ?Follow-up: Return if symptoms worsen or fail to improve.  ? ?PLAN ?Phlebitis vs infection. No clear signs on exam. Pain is evident with antecubital tenderness. Given her upcoming travel, will give abx and antiinflammatories. Recommend ice/heat, compression as tolerated. Will [place] if failing to improve. ?Patient encouraged to call clinic with any questions, comments, or concerns. ? ?Janeece Agee, NP ?

## 2021-09-15 ENCOUNTER — Ambulatory Visit: Payer: 59 | Admitting: Registered Nurse

## 2021-09-15 LAB — EXTRA SPECIMEN

## 2021-09-15 LAB — HIV-1 RNA ULTRAQUANT REFLEX TO GENTYP+

## 2021-10-01 ENCOUNTER — Other Ambulatory Visit (HOSPITAL_COMMUNITY)
Admission: RE | Admit: 2021-10-01 | Discharge: 2021-10-01 | Disposition: A | Discharge status: Home / Self Care | Payer: 59 | Source: Ambulatory Visit | Attending: Obstetrics & Gynecology | Admitting: Obstetrics & Gynecology

## 2021-10-01 ENCOUNTER — Other Ambulatory Visit: Payer: Self-pay

## 2021-10-01 ENCOUNTER — Encounter (HOSPITAL_BASED_OUTPATIENT_CLINIC_OR_DEPARTMENT_OTHER): Payer: Self-pay | Admitting: Obstetrics & Gynecology

## 2021-10-01 ENCOUNTER — Ambulatory Visit (HOSPITAL_BASED_OUTPATIENT_CLINIC_OR_DEPARTMENT_OTHER): Payer: 59 | Admitting: Obstetrics & Gynecology

## 2021-10-01 VITALS — BP 113/66 | HR 78 | Ht 66.0 in | Wt 104.0 lb

## 2021-10-01 DIAGNOSIS — Z113 Encounter for screening for infections with a predominantly sexual mode of transmission: Secondary | ICD-10-CM

## 2021-10-01 DIAGNOSIS — Z124 Encounter for screening for malignant neoplasm of cervix: Secondary | ICD-10-CM

## 2021-10-01 DIAGNOSIS — Z01419 Encounter for gynecological examination (general) (routine) without abnormal findings: Secondary | ICD-10-CM

## 2021-10-01 NOTE — Progress Notes (Signed)
21 y.o. G26A1 Single Asian female here for annual exam/new patient exam.  She was referred from Dr. Kateri Plummer.  Is on Xulane patches for contraception.  Uses condoms.  Is SA.  GC/Chl done 09/03/2021.  ? ?Patient's last menstrual period was 09/24/2021.          ?Sexually active: Yes.    ?The current method of family planning is Ortho-Evra patches weekly.    ?Smoker:  no ? ?Health Maintenance: ?Pap:  never had one ?MMG:  guidelines reviewed ?Colonoscopy:  guidelines reviewed  ?Screening Labs: reviewed lab work from 09/03/2021 ? ? reports that she has never smoked. She does not have any smokeless tobacco history on file. She reports current alcohol use. She reports that she does not use drugs. ? ?Past Medical History:  ?Diagnosis Date  ? ADD (attention deficit disorder)   ? ? ?Past Surgical History:  ?Procedure Laterality Date  ? right foot bunion surgery  02/18/2021  ? ? ?Current Outpatient Medications  ?Medication Sig Dispense Refill  ? amphetamine-dextroamphetamine (ADDERALL) 5 MG tablet Take 5 mg daily by mouth. Pt stated, "I take as needed"    ? norelgestromin-ethinyl estradiol [employer]ico) 150-35 MCG/24HR transdermal patch Xulane 150 mcg-35 mcg/24 hr transdermal patch  APPLY 1 PATCH EVERY WEEK BY TRANSDERMAL ROUTE AS DIRECTED. 9 patch 4  ? ?No current facility-administered medications for this visit.  ? ? ?Family History  ?Problem Relation Age of Onset  ? Gout Father   ? ? ?Review of Systems  ?All other systems reviewed and are negative. ? ?Exam:   ?BP 113/66 (BP Location: Right Arm, Patient Position: Sitting, Cuff Size: Normal)   Pulse 78   Ht 5\' 6"  (1.676 m)   Wt 104 lb (47.2 kg)   LMP 09/24/2021   BMI 16.79 kg/m?   Height: 5\' 6"  (167.6 cm) ? ?General appearance: alert, cooperative and appears stated age ?Head: Normocephalic, without obvious abnormality, atraumatic ?Neck: no adenopathy, supple, symmetrical, trachea midline and thyroid normal to inspection and palpation ?Lungs: clear to auscultation  bilaterally ?Breasts: normal appearance, no masses or tenderness ?Heart: regular rate and rhythm ?Abdomen: soft, non-tender; bowel sounds normal; no masses,  no organomegaly ?Extremities: extremities normal, atraumatic, no cyanosis or edema ?Skin: Skin color, texture, turgor normal. No rashes or lesions ?Lymph nodes: Cervical, supraclavicular, and axillary nodes normal. ?No abnormal inguinal nodes palpated ?Neurologic: Grossly normal ? ? ?Pelvic: External genitalia:  no lesions ?             Urethra:  normal appearing urethra with no masses, tenderness or lesions ?             Bartholins and Skenes: normal    ?             Vagina: normal appearing vagina with normal color and no discharge, no lesions ?             Cervix: no lesions ?             Pap taken: Yes.   ?Bimanual Exam:  Uterus:  normal size, contour, position, consistency, mobility, non-tender ?             Adnexa: normal adnexa and no mass, fullness, tenderness ?              Rectovaginal: Confirms ?              Anus:  normal sphincter tone, no lesions ? ?Chaperone, 09/26/2021, CMA, was present for exam. ? ?Assessment/Plan: ?1. Well woman  exam with routine gynecological exam ?- pap obtained today ?- breast and colon cancer screening guidelines reviewed ?- vaccines reviewed/updated ? ?2. Cervical cancer screening ? ? ?

## 2021-10-06 LAB — CYTOLOGY - PAP: Diagnosis: NEGATIVE

## 2021-12-09 ENCOUNTER — Encounter: Payer: 59 | Admitting: Radiology

## 2021-12-10 ENCOUNTER — Encounter: Payer: Self-pay | Admitting: Nurse Practitioner

## 2021-12-10 ENCOUNTER — Ambulatory Visit: Payer: 59 | Admitting: Nurse Practitioner

## 2021-12-10 VITALS — BP 110/64 | Ht 66.0 in | Wt 99.0 lb

## 2021-12-10 DIAGNOSIS — N898 Other specified noninflammatory disorders of vagina: Secondary | ICD-10-CM | POA: Diagnosis not present

## 2021-12-10 DIAGNOSIS — N3000 Acute cystitis without hematuria: Secondary | ICD-10-CM | POA: Diagnosis not present

## 2021-12-10 DIAGNOSIS — Z3009 Encounter for other general counseling and advice on contraception: Secondary | ICD-10-CM | POA: Diagnosis not present

## 2021-12-10 DIAGNOSIS — R350 Frequency of micturition: Secondary | ICD-10-CM

## 2021-12-10 LAB — WET PREP FOR TRICH, YEAST, CLUE

## 2021-12-10 MED ORDER — SULFAMETHOXAZOLE-TRIMETHOPRIM 800-160 MG PO TABS: 1.0000 | ORAL_TABLET | Freq: Two times a day (BID) | ORAL | 0 refills | Status: AC

## 2021-12-10 NOTE — Progress Notes (Signed)
   Acute Office Visit  Subjective:    Patient ID: Casey Kirby, female    DOB: [birth year], 21 y.o.   MRN: [id number]   HPI 21 y.o. presents today for vaginal itching and urinary frequency. She used an untreated hot tub a couple of weeks ago. Denies discharge or odor, dysuria, or urgency. She has also been having irregular bleeding. She uses ortho evra patches inconsistently. Not using patch now. Sexually active. LMP 12/02/2021.    Review of Systems  Constitutional: Negative.   Genitourinary:  Positive for frequency, menstrual problem and vaginal pain (Itching). Negative for difficulty urinating, dysuria, flank pain, hematuria, pelvic pain and vaginal discharge.      Objective:    Physical Exam Constitutional:      Appearance: Normal appearance.  Abdominal:     Tenderness: There is no right CVA tenderness or left CVA tenderness.  Genitourinary:    General: Normal vulva.     Vagina: Normal.     Cervix: Normal.    BP 110/64   Ht 5\' 6"  (1.676 m)   Wt 99 lb (44.9 kg)   LMP 12/02/2021   BMI 15.98 kg/m  Wt Readings from Last 3 Encounters:  12/10/21 99 lb (44.9 kg)  10/01/21 104 lb (47.2 kg)  09/14/21 101 lb (45.8 kg)        Patient informed chaperone available to be present for breast and/or pelvic exam. Patient has requested no chaperone to be present. Patient has been advised what will be completed during breast and pelvic exam.   Wet prep negative UA: 1+ leukocytes, negative nitrites, 1+ protein, trace blood, pH 5.0, dark yellow/cloudy. Microscopic: wcb 10-20, rbc 0-2, many bacteria, squ epith 10-20  Assessment & Plan:   Problem List Items Addressed This Visit   None Visit Diagnoses     Acute cystitis without hematuria    -  Primary   Relevant Medications   sulfamethoxazole-trimethoprim (BACTRIM DS) 800-160 MG tablet   Vagina itching       Relevant Orders   WET PREP FOR TRICH, YEAST, CLUE   Frequent urination       Relevant Orders   Urinalysis,Complete w/RFL  Culture   General counseling and advice on female contraception          Plan: Negative wet prep and exam. Recommend OTC hydrocortisone ointment externally BID x 7 days. Urinalysis suggestive of UTI - Bactrim 800-160 mg BID x 3 days, culture pending. Increase water intake. Discussed importance of using contraceptive consistently to avoid pregnancy and irregular bleeding. We discussed other methods to include vaginal ring, Nexplanon, Depo, and IUD. She wants to try to be more consistent with patch. Recommend downloading app to keep up with cycle and exchanging of patches. She is agreeable to plan.      11/14/21 DNP, 11:02 AM 12/10/2021

## 2021-12-12 LAB — URINALYSIS, COMPLETE W/RFL CULTURE
Bilirubin Urine: NEGATIVE
Glucose, UA: NEGATIVE
Ketones, ur: NEGATIVE
Nitrites, Initial: NEGATIVE
Specific Gravity, Urine: 1.028 (ref 1.001–1.035)
pH: 5 (ref 5.0–8.0)

## 2021-12-12 LAB — URINE CULTURE
MICRO NUMBER:: 13470286
SPECIMEN QUALITY:: ADEQUATE

## 2021-12-12 LAB — CULTURE INDICATED

## 2022-03-29 ENCOUNTER — Ambulatory Visit: Payer: 59 | Admitting: Family

## 2022-03-29 ENCOUNTER — Encounter: Payer: Self-pay | Admitting: Family

## 2022-03-29 VITALS — BP 97/66 | HR 72 | Temp 98.2°F | Ht 66.0 in | Wt 104.0 lb

## 2022-03-29 DIAGNOSIS — R42 Dizziness and giddiness: Secondary | ICD-10-CM

## 2022-03-29 DIAGNOSIS — M79671 Pain in right foot: Secondary | ICD-10-CM

## 2022-03-29 DIAGNOSIS — Z789 Other specified health status: Secondary | ICD-10-CM

## 2022-03-29 DIAGNOSIS — R636 Underweight: Secondary | ICD-10-CM

## 2022-03-29 DIAGNOSIS — N926 Irregular menstruation, unspecified: Secondary | ICD-10-CM

## 2022-03-29 DIAGNOSIS — M79672 Pain in left foot: Secondary | ICD-10-CM

## 2022-03-29 MED ORDER — XULANE 150-35 MCG/24HR TD PTWK: MEDICATED_PATCH | TRANSDERMAL | 0 refills | Status: DC
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
## 2022-03-29 NOTE — Patient Instructions (Addendum)
It was very nice to meet you today!   For your menstrual cycle - continue to reapply your patch every week for the next 8 weeks, do not leave off for the week of your cycle, and let me know if this is helping to control your bleeding.   Call the podiatrist at [facility] regarding your foot pain. Let them know  you have had continued pain since surgery. Dr. March Rummage is who you have seen before.  For the dizziness you need to be sure you are drinking at least 2 liters of water every [place] = 8 cups. You also have to eat throughout the [place]. Eating small mini meals throughout the [place] will keep your blood sugar from dropping and making you feel weak/almost passing out.  If you can't eat snacks because of your braces, you need to try and drink your calories with protein rich shakes or juices.  Let me know if you still are having trouble after trying the above.     PLEASE NOTE:  If you had any lab tests please let us know if you have not heard back within a few days. You may see your results on MyChart before we have a chance to review them but we will give you a call once they are reviewed by [place]. If we ordered any referrals today, please let us know if you have not heard from their office within the next week.

## 2022-03-30 LAB — CBC WITH DIFFERENTIAL/PLATELET
Basophils Absolute: 0 10*3/uL (ref 0.0–0.1)
Basophils Relative: 0.4 % (ref 0.0–3.0)
Eosinophils Absolute: 0.1 10*3/uL (ref 0.0–0.7)
Eosinophils Relative: 2 % (ref 0.0–5.0)
HCT: 43 % (ref 36.0–46.0)
Hemoglobin: 14.4 g/dL (ref 12.0–15.0)
Lymphocytes Relative: 33.4 % (ref 12.0–46.0)
Lymphs Abs: 2 10*3/uL (ref 0.7–4.0)
MCHC: 33.4 g/dL (ref 30.0–36.0)
MCV: 92.7 fl (ref 78.0–100.0)
Monocytes Absolute: 0.3 10*3/uL (ref 0.1–1.0)
Monocytes Relative: 5.4 % (ref 3.0–12.0)
Neutro Abs: 3.5 10*3/uL (ref 1.4–7.7)
Neutrophils Relative %: 58.8 % (ref 43.0–77.0)
Platelets: 288 10*3/uL (ref 150.0–400.0)
RBC: 4.64 Mil/uL (ref 3.87–5.11)
RDW: 13 % (ref 11.5–15.5)
WBC: 6 10*3/uL (ref 4.0–10.5)

## 2022-03-30 LAB — T4, FREE: Free T4: 0.83 ng/dL (ref 0.60–1.60)

## 2022-03-30 LAB — VITAMIN D 25 HYDROXY (VIT D DEFICIENCY, FRACTURES): VITD: 23.11 ng/mL — ABNORMAL LOW (ref 30.00–100.00)

## 2022-04-05 ENCOUNTER — Encounter: Payer: Self-pay | Admitting: *Deleted

## 2022-04-05 NOTE — Progress Notes (Signed)
Your blood count & thyroid labs are all normal, just your Vitamin D level is a little low. You can take up to 2,000units over the counter to replace this daily. Look for Vitamin D3 for best results. Low Vitamin D can contribute to some fatigue.

## 2022-04-09 ENCOUNTER — Encounter: Payer: Self-pay | Admitting: Family

## 2022-04-09 ENCOUNTER — Ambulatory Visit: Payer: 59 | Admitting: Family

## 2022-04-09 ENCOUNTER — Other Ambulatory Visit (HOSPITAL_COMMUNITY)
Admission: RE | Admit: 2022-04-09 | Discharge: 2022-04-09 | Disposition: A | Discharge status: Home / Self Care | Payer: 59 | Source: Ambulatory Visit | Attending: Family | Admitting: Family

## 2022-04-09 VITALS — BP 98/64 | HR 69 | Temp 98.2°F | Ht 66.0 in | Wt 105.6 lb

## 2022-04-09 DIAGNOSIS — R350 Frequency of micturition: Secondary | ICD-10-CM

## 2022-04-09 DIAGNOSIS — R300 Dysuria: Secondary | ICD-10-CM

## 2022-04-09 DIAGNOSIS — Z23 Encounter for immunization: Secondary | ICD-10-CM | POA: Diagnosis not present

## 2022-04-09 LAB — POCT URINALYSIS DIPSTICK
Bilirubin, UA: NEGATIVE
Blood, UA: NEGATIVE
Glucose, UA: NEGATIVE
Ketones, UA: NEGATIVE
Leukocytes, UA: NEGATIVE
Nitrite, UA: NEGATIVE
Protein, UA: NEGATIVE
Spec Grav, UA: >=1.03 — AB (ref 1.010–1.025)
Urobilinogen, UA: 0.2 E.U./dL
pH, UA: 6 (ref 5.0–8.0)

## 2022-04-09 NOTE — Progress Notes (Signed)
   Patient ID: Casey Kirby, female    DOB: Aug 31, 2000, 21 y.o.   MRN: [id number]  Chief Complaint  Patient presents with   Urinary Frequency    Pt c/o urinary frequency, Dysuria and urinary urgency. Pt c/o urine being red. Present since yesterday and today.  Has tried cranberry capsules.     HPI:    Urinary symptoms: Patient c/o  dysuria, frequency, urgency; Denies:  flank pain, pelvic pain, low back pain, foul odor, cloudy urine, or hematuria. . Other sx: vaginal d/c: none, vaginal itching: no, but skin feels irritated. Duration of sx: 2d; Home tx: cranberry tablets; Denies  nausea, fever. Reports last UTI a few months ago.       Assessment & Plan:  1. Need for vaccination  - Tdap vaccine greater than or equal to 7yo IM  2. Urinary frequency UA neg for UTI. sending out for STI, yeast & BV  - POCT Urinalysis Dipstick  3. Dysuria UA neg for UTI. sending out for STI, yeast & BV. Advised pt on drinking more water, drinks a lot of coffee, advised this is dehydrating.  - Urine cytology ancillary only   Subjective:    Outpatient Medications Prior to Visit  Medication Sig Dispense Refill   norelgestromin-ethinyl estradiol Marilu Favre) 150-35 MCG/24HR transdermal patch Xulane 150 mcg-35 mcg/24 hr transdermal patch  APPLY 1 PATCH EVERY WEEK BY TRANSDERMAL ROUTE AS DIRECTED. 9 patch 0   No facility-administered medications prior to visit.   Past Medical History:  Diagnosis Date   ADD (attention deficit disorder)    Depression    Past Surgical History:  Procedure Laterality Date   right foot bunion surgery  02/18/2021   No Known Allergies    Objective:    Physical Exam Vitals and nursing note reviewed.  Constitutional:      Appearance: Normal appearance.  Cardiovascular:     Rate and Rhythm: Normal rate and regular rhythm.  Pulmonary:     Effort: Pulmonary effort is normal.     Breath sounds: Normal breath sounds.  Musculoskeletal:        General: Normal range of motion.   Skin:    General: Skin is warm and dry.  Neurological:     Mental Status: She is alert.  Psychiatric:        Mood and Affect: Mood normal.        Behavior: Behavior normal.    BP 98/64 (BP Location: Left Arm, Patient Position: Sitting, Cuff Size: Large)   Pulse 69   Temp 98.2 F (36.8 C) (Temporal)   Ht 5\' 6"  (1.676 m)   Wt 105 lb 9.6 oz (47.9 kg)   LMP  (LMP Unknown) Comment: PATCH  SpO2 99%   BMI 17.04 kg/m  Wt Readings from Last 3 Encounters:  04/09/22 105 lb 9.6 oz (47.9 kg)  03/29/22 104 lb (47.2 kg)  12/10/21 99 lb (44.9 kg)       Jeanie Sewer, NP

## 2022-04-14 LAB — URINE CYTOLOGY ANCILLARY ONLY
Bacterial Vaginitis-Urine: NEGATIVE
Candida Urine: NEGATIVE
Chlamydia: NEGATIVE
Comment: NEGATIVE
Comment: NEGATIVE
Comment: NORMAL
Neisseria Gonorrhea: NEGATIVE
Trichomonas: NEGATIVE

## 2022-04-15 NOTE — Progress Notes (Signed)
Your urine testing is negative for yeast, bacterial vaginosis, or any STDs.  Take care!

## 2022-06-24 ENCOUNTER — Encounter: Payer: Self-pay | Admitting: *Deleted
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
## 2022-08-17 ENCOUNTER — Ambulatory Visit (INDEPENDENT_AMBULATORY_CARE_PROVIDER_SITE_OTHER): Payer: 59 | Admitting: Family

## 2022-08-17 ENCOUNTER — Encounter: Payer: Self-pay | Admitting: Family

## 2022-08-17 VITALS — BP 97/63 | HR 72 | Temp 96.9°F | Ht 66.0 in | Wt 112.0 lb

## 2022-08-17 DIAGNOSIS — Z789 Other specified health status: Secondary | ICD-10-CM | POA: Diagnosis not present

## 2022-08-17 DIAGNOSIS — N926 Irregular menstruation, unspecified: Secondary | ICD-10-CM

## 2022-08-17 MED ORDER — NORELGESTROMIN-ETH ESTRADIOL 150-35 MCG/24HR TD PTWK: MEDICATED_PATCH | TRANSDERMAL | 3 refills | Status: DC

## 2022-08-17 NOTE — Assessment & Plan Note (Addendum)
chronic, stable pt using OCP patch continuously reports cycles are better,  occur with light spotting about q34months reports bad cramping  advised ok to take 3 Ibuprofen tid prn for pain after eating refill for patch sent reminded pt to call GYN w/new menstrual cycle concerns f/u 1 year

## 2022-09-08 ENCOUNTER — Encounter: Payer: 59 | Admitting: Registered Nurse

## 2023-02-04 ENCOUNTER — Ambulatory Visit (INDEPENDENT_AMBULATORY_CARE_PROVIDER_SITE_OTHER): Payer: 59 | Admitting: Family

## 2023-02-04 VITALS — BP 109/77 | HR 74 | Temp 97.6°F | Ht 66.0 in | Wt 107.0 lb

## 2023-02-04 DIAGNOSIS — R110 Nausea: Secondary | ICD-10-CM

## 2023-02-04 DIAGNOSIS — S060X9A Concussion with loss of consciousness of unspecified duration, initial encounter: Secondary | ICD-10-CM

## 2023-02-04 DIAGNOSIS — S0101XA Laceration without foreign body of scalp, initial encounter: Secondary | ICD-10-CM

## 2023-02-04 MED ORDER — ONDANSETRON 4 MG PO TBDP: 4.0000 mg | ORAL_TABLET | Freq: Three times a day (TID) | ORAL | 0 refills | Status: DC | PRN
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
## 2023-02-04 NOTE — Patient Instructions (Signed)
It was very nice to see you today!   See the handout attached.      PLEASE NOTE:  If you had any lab tests please let us know if you have not heard back within a few days. You may see your results on MyChart before we have a chance to review them but we will give you a call once they are reviewed by [place]. If we ordered any referrals today, please let us know if you have not heard from their office within the next week.

## 2023-02-05 ENCOUNTER — Emergency Department (HOSPITAL_BASED_OUTPATIENT_CLINIC_OR_DEPARTMENT_OTHER): Payer: 59

## 2023-02-05 ENCOUNTER — Emergency Department (HOSPITAL_BASED_OUTPATIENT_CLINIC_OR_DEPARTMENT_OTHER)
Admission: EM | Admit: 2023-02-05 | Discharge: 2023-02-05 | Disposition: A | Discharge status: Home / Self Care | Payer: 59 | Source: Home / Self Care | Attending: Emergency Medicine | Admitting: Emergency Medicine

## 2023-02-05 ENCOUNTER — Other Ambulatory Visit: Payer: Self-pay

## 2023-02-05 ENCOUNTER — Encounter (HOSPITAL_BASED_OUTPATIENT_CLINIC_OR_DEPARTMENT_OTHER): Payer: Self-pay

## 2023-02-05 DIAGNOSIS — R519 Headache, unspecified: Secondary | ICD-10-CM | POA: Diagnosis not present

## 2023-02-05 DIAGNOSIS — R55 Syncope and collapse: Secondary | ICD-10-CM | POA: Insufficient documentation

## 2023-02-05 DIAGNOSIS — M542 Cervicalgia: Secondary | ICD-10-CM | POA: Diagnosis not present

## 2023-02-05 DIAGNOSIS — W19XXXA Unspecified fall, initial encounter: Secondary | ICD-10-CM | POA: Diagnosis not present

## 2023-02-05 LAB — CBC WITH DIFFERENTIAL/PLATELET
Abs Immature Granulocytes: 0.01 10*3/uL (ref 0.00–0.07)
Basophils Absolute: 0 10*3/uL (ref 0.0–0.1)
Basophils Relative: 0 %
Eosinophils Absolute: 0.1 10*3/uL (ref 0.0–0.5)
Eosinophils Relative: 2 %
HCT: 39.3 % (ref 36.0–46.0)
Hemoglobin: 13.3 g/dL (ref 12.0–15.0)
Immature Granulocytes: 0 %
Lymphocytes Relative: 30 %
Lymphs Abs: 2.1 10*3/uL (ref 0.7–4.0)
MCH: 30.4 pg (ref 26.0–34.0)
MCHC: 33.8 g/dL (ref 30.0–36.0)
MCV: 89.9 fL (ref 80.0–100.0)
Monocytes Absolute: 0.4 10*3/uL (ref 0.1–1.0)
Monocytes Relative: 6 %
Neutro Abs: 4.4 10*3/uL (ref 1.7–7.7)
Neutrophils Relative %: 62 %
Platelets: 216 10*3/uL (ref 150–400)
RBC: 4.37 MIL/uL (ref 3.87–5.11)
RDW: 12.8 % (ref 11.5–15.5)
WBC: 7 10*3/uL (ref 4.0–10.5)
nRBC: 0 % (ref 0.0–0.2)

## 2023-02-05 LAB — I-STAT CHEM 8, ED
BUN: 12 mg/dL (ref 6–20)
Calcium, Ion: 1.21 mmol/L (ref 1.15–1.40)
Chloride: 104 mmol/L (ref 98–111)
Creatinine, Ser: 0.6 mg/dL (ref 0.44–1.00)
Glucose, Bld: 107 mg/dL — ABNORMAL HIGH (ref 70–99)
HCT: 40 % (ref 36.0–46.0)
Hemoglobin: 13.6 g/dL (ref 12.0–15.0)
Potassium: 3.8 mmol/L (ref 3.5–5.1)
Sodium: 140 mmol/L (ref 135–145)
TCO2: 23 mmol/L (ref 22–32)

## 2023-02-05 LAB — BASIC METABOLIC PANEL
Anion gap: 7 (ref 5–15)
BUN: 13 mg/dL (ref 6–20)
CO2: 25 mmol/L (ref 22–32)
Calcium: 8.5 mg/dL — ABNORMAL LOW (ref 8.9–10.3)
Chloride: 107 mmol/L (ref 98–111)
Creatinine, Ser: 0.52 mg/dL (ref 0.44–1.00)
GFR, Estimated: >60 mL/min (ref >60)
Glucose, Bld: 107 mg/dL — ABNORMAL HIGH (ref 70–99)
Potassium: 3.9 mmol/L (ref 3.5–5.1)
Sodium: 139 mmol/L (ref 135–145)

## 2023-02-05 LAB — CBG MONITORING, ED: Glucose-Capillary: 94 mg/dL (ref 70–99)

## 2023-02-05 LAB — PREGNANCY, URINE: Preg Test, Ur: NEGATIVE

## 2023-02-05 MED ORDER — METHOCARBAMOL 500 MG PO TABS: 500.0000 mg | ORAL_TABLET | Freq: Two times a day (BID) | ORAL | 0 refills | Status: DC
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
## 2023-02-05 NOTE — Discharge Instructions (Addendum)
Your CT scans were reassuring today.  Please take tylenol/ibuprofen and robacin for pain. I recommend close follow-up with PCP for reevaluation.  Please do not hesitate to return to emergency department if worrisome signs symptoms we discussed become apparent.

## 2023-02-11 ENCOUNTER — Ambulatory Visit: Payer: 59 | Admitting: Family

## 2023-10-19 ENCOUNTER — Ambulatory Visit (INDEPENDENT_AMBULATORY_CARE_PROVIDER_SITE_OTHER): Admitting: Family

## 2023-10-19 ENCOUNTER — Encounter: Payer: Self-pay | Admitting: Family

## 2023-10-19 VITALS — BP 91/55 | HR 75 | Temp 98.1°F | Ht 66.0 in | Wt 104.2 lb

## 2023-10-19 DIAGNOSIS — Z1159 Encounter for screening for other viral diseases: Secondary | ICD-10-CM | POA: Diagnosis not present

## 2023-10-19 DIAGNOSIS — Z0000 Encounter for general adult medical examination without abnormal findings: Secondary | ICD-10-CM | POA: Diagnosis not present

## 2023-10-19 DIAGNOSIS — Z13220 Encounter for screening for lipoid disorders: Secondary | ICD-10-CM | POA: Diagnosis not present

## 2023-10-19 LAB — LIPID PANEL
Cholesterol: 146 mg/dL (ref 0–200)
HDL: 61.6 mg/dL (ref >39.00)
LDL Cholesterol: 63 mg/dL (ref 0–99)
NonHDL: 84.82
Total CHOL/HDL Ratio: 2
Triglycerides: 107 mg/dL (ref 0.0–149.0)
VLDL: 21.4 mg/dL (ref 0.0–40.0)

## 2023-10-19 LAB — CBC WITH DIFFERENTIAL/PLATELET
Basophils Absolute: 0 10*3/uL (ref 0.0–0.1)
Basophils Relative: 0.4 % (ref 0.0–3.0)
Eosinophils Absolute: 0 10*3/uL (ref 0.0–0.7)
Eosinophils Relative: 0.9 % (ref 0.0–5.0)
HCT: 42 % (ref 36.0–46.0)
Hemoglobin: 14.2 g/dL (ref 12.0–15.0)
Lymphocytes Relative: 31.2 % (ref 12.0–46.0)
Lymphs Abs: 1.5 10*3/uL (ref 0.7–4.0)
MCHC: 33.8 g/dL (ref 30.0–36.0)
MCV: 91.9 fl (ref 78.0–100.0)
Monocytes Absolute: 0.3 10*3/uL (ref 0.1–1.0)
Monocytes Relative: 5.1 % (ref 3.0–12.0)
Neutro Abs: 3.1 10*3/uL (ref 1.4–7.7)
Neutrophils Relative %: 62.4 % (ref 43.0–77.0)
Platelets: 222 10*3/uL (ref 150.0–400.0)
RBC: 4.57 Mil/uL (ref 3.87–5.11)
RDW: 12.6 % (ref 11.5–15.5)
WBC: 4.9 10*3/uL (ref 4.0–10.5)

## 2023-10-19 LAB — COMPREHENSIVE METABOLIC PANEL WITH GFR
ALT: 14 U/L (ref 0–35)
AST: 15 U/L (ref 0–37)
Albumin: 4.7 g/dL (ref 3.5–5.2)
Alkaline Phosphatase: 31 U/L — ABNORMAL LOW (ref 39–117)
BUN: 9 mg/dL (ref 6–23)
CO2: 28 meq/L (ref 19–32)
Calcium: 9.2 mg/dL (ref 8.4–10.5)
Chloride: 102 meq/L (ref 96–112)
Creatinine, Ser: 0.75 mg/dL (ref 0.40–1.20)
GFR: 111.88 mL/min (ref >60.00)
Glucose, Bld: 120 mg/dL — ABNORMAL HIGH (ref 70–99)
Potassium: 3.5 meq/L (ref 3.5–5.1)
Sodium: 139 meq/L (ref 135–145)
Total Bilirubin: 0.7 mg/dL (ref 0.2–1.2)
Total Protein: 7.1 g/dL (ref 6.0–8.3)

## 2023-10-19 LAB — TSH: TSH: 0.49 u[IU]/mL (ref 0.35–5.50)

## 2023-10-19 NOTE — Progress Notes (Signed)
 Phone [phone]  Subjective:   Patient is a 23 y.o. female presenting for annual physical.    Chief Complaint  Patient presents with   Annual Exam    Non fasting w/ labs  Discussed the use of AI scribe software for clinical note transcription with the patient, who gave verbal consent to proceed.  History of Present Illness The patient, with a history of acne and irregular menstrual cycles, presents for a routine physical and to discuss due vaccines. The patient is currently on Xulane for birth control and has recently started a new prescription from a dermatologist for acne. The patient reports irregular bleeding while on birth control, with episodes of bleeding three times a month, followed by six months of continuous bleeding, and then no bleeding for six months. The patient has recently resumed the birth control and is currently in the third week of the cycle. The patient also mentions feeling tired, which could be due to a low blood pressure reading during the visit or due to a disrupted sleep schedule due to work commitments.   See problem oriented charting- ROS- full  review of systems was completed and negative except for what is noted in HPI above.  The following were reviewed and entered/updated in epic: Past Medical History:  Diagnosis Date   ADD (attention deficit disorder)    Depression    Patient Active Problem List   Diagnosis Date Noted   Irregular menses 08/17/2022   Asperger's disorder 02/14/2019   Past Surgical History:  Procedure Laterality Date   right foot bunion surgery  02/18/2021    Family History  Problem Relation Age of Onset   Gout Father     Medications- reviewed and updated Current Outpatient Medications  Medication Sig Dispense Refill   methocarbamol (ROBAXIN) 500 MG tablet Take 1 tablet (500 mg total) by mouth 2 (two) times daily. 20 tablet 0   norelgestromin-ethinyl estradiol [employer]) 150-35 MCG/24HR transdermal patch Xulane 150 mcg-35  mcg/24 hr transdermal patch  APPLY 1 PATCH EVERY WEEK BY TRANSDERMAL ROUTE AS DIRECTED. 9 patch 3   ondansetron (ZOFRAN-ODT) 4 MG disintegrating tablet Take 1 tablet (4 mg total) by mouth every 8 (eight) hours as needed for nausea or vomiting. 20 tablet 0   No current facility-administered medications for this visit.    Allergies-reviewed and updated No Known Allergies  Social History   Social History Narrative   Not on file    Objective:  BP (!) 91/55 (BP Location: Left Arm, Patient Position: Sitting, Cuff Size: Normal)   Pulse 75   Temp 98.1 F (36.7 C) (Temporal)   Ht 5\' 6"  (1.676 m)   Wt 104 lb 4 oz (47.3 kg)   LMP  (LMP Unknown)   SpO2 94%   BMI 16.83 kg/m  Physical Exam Vitals and nursing note reviewed.  Constitutional:      Appearance: Normal appearance.  HENT:     Head: Normocephalic.     Right Ear: Tympanic membrane normal.     Left Ear: Tympanic membrane normal.     Nose: Nose normal.     Mouth/Throat:     Mouth: Mucous membranes are moist.  Eyes:     Pupils: Pupils are equal, round, and reactive to light.  Cardiovascular:     Rate and Rhythm: Normal rate and regular rhythm.  Pulmonary:     Effort: Pulmonary effort is normal.     Breath sounds: Normal breath sounds.  Musculoskeletal:        General:  Normal range of motion.     Cervical back: Normal range of motion.  Lymphadenopathy:     Cervical: No cervical adenopathy.  Skin:    General: Skin is warm and dry.  Neurological:     Mental Status: She is alert.  Psychiatric:        Mood and Affect: Mood normal.        Behavior: Behavior normal.      Assessment and Plan   Health Maintenance counseling: 1. Anticipatory guidance: Patient counseled regarding regular dental exams q6 months, eye exams,  avoiding smoking and second hand smoke, limiting alcohol to 1 beverage per Reader, no illicit drugs.   2. Risk factor reduction:  Advised patient of need for regular exercise and diet rich with fruits and  vegetables to reduce risk of heart attack and stroke. Wt Readings from Last 3 Encounters:  10/19/23 104 lb 4 oz (47.3 kg)  02/05/23 102 lb 11.2 oz (46.6 kg)  02/04/23 107 lb (48.5 kg)   3. Immunizations/screenings/ancillary studies Immunization History  Administered Date(s) Administered   HPV 9-valent 12/25/2010, 03/10/2012, 03/05/2013   PFIZER(Purple Top)SARS-COV-2 Vaccination 10/18/2019, 11/08/2019   Pfizer Covid-19 Vaccine Bivalent Booster 53yrs & up 07/03/2020   Tdap 04/09/2022   There are no preventive care reminders to display for this patient.   4. Cervical cancer screening: followed by GYN 5. Skin cancer screening- advised regular sunscreen use. Denies worrisome, changing, or new skin lesions.  6. Birth control/STD check: Xulane, has appt w/GYN tomorrow 7. Smoking associated screening:  non- smoker 8. Alcohol screening: occasionally  Assessment & Plan Irregular Menstrual Bleeding - Irregular bleeding possibly due to inconsistent Xulane patch use or hormonal fluctuations. Potential anemia risk due to excessive bleeding. Symptoms like fatigue and headaches may suggest anemia. - Consult gynecologist for menstrual irregularities. - Monitor for anemia symptoms: fatigue, dyspnea, chest tightness, headaches. - Review lab results for anemia indicators.  General Health Maintenance - Vaccinations up to date. Occasional alcohol use, no smoking or vaping. Pap smear due in three years. Low blood pressure noted. - Conduct routine CPE blood work today including Hepatitis C screening. - Encourage healthy diet and exercise daily. - Administer influenza vaccine in fall. No other vaccines due at this time. - Advise on hydration with at least 2L fluids daily to avoid low blood pressure. - Review lab results by Friday or Monday, communicate via MyChart.   Recommended follow up:  Return for any future concerns, Complete physical w/fasting labs. Future Appointments  Date Time Provider  Department Center  10/20/2023  8:30 AM Olivia Mackie, NP [facility] None    Lab/Order associations:fasting    Dulce Sellar, NP

## 2023-10-19 NOTE — Patient Instructions (Signed)
 It was very nice to see you today!   I will review your lab results via MyChart in a few days.  Remember to eat at least 2 good meals a Sudol and hydrate with up to 2 liters of non-caffeinated beverages daily!  Stay well! Get outside and enjoy the Spring season!    PLEASE NOTE:  If you had any lab tests please let us know if you have not heard back within a few days. You may see your results on MyChart before we have a chance to review them but we will give you a call once they are reviewed by [place]. If we ordered any referrals today, please let us know if you have not heard from their office within the next week.

## 2023-10-20 ENCOUNTER — Ambulatory Visit (INDEPENDENT_AMBULATORY_CARE_PROVIDER_SITE_OTHER): Admitting: Nurse Practitioner

## 2023-10-20 ENCOUNTER — Encounter: Payer: Self-pay | Admitting: Nurse Practitioner

## 2023-10-20 VITALS — BP 102/68 | HR 64

## 2023-10-20 DIAGNOSIS — N760 Acute vaginitis: Secondary | ICD-10-CM | POA: Diagnosis not present

## 2023-10-20 DIAGNOSIS — N898 Other specified noninflammatory disorders of vagina: Secondary | ICD-10-CM

## 2023-10-20 DIAGNOSIS — N926 Irregular menstruation, unspecified: Secondary | ICD-10-CM | POA: Diagnosis not present

## 2023-10-20 DIAGNOSIS — F520 Hypoactive sexual desire disorder: Secondary | ICD-10-CM

## 2023-10-20 DIAGNOSIS — Z3009 Encounter for other general counseling and advice on contraception: Secondary | ICD-10-CM

## 2023-10-20 DIAGNOSIS — Z3045 Encounter for surveillance of transdermal patch hormonal contraceptive device: Secondary | ICD-10-CM

## 2023-10-20 DIAGNOSIS — R350 Frequency of micturition: Secondary | ICD-10-CM | POA: Diagnosis not present

## 2023-10-20 LAB — WET PREP FOR TRICH, YEAST, CLUE

## 2023-10-20 LAB — HEPATITIS C ANTIBODY: Hepatitis C Ab: NONREACTIVE

## 2023-10-20 MED ORDER — NORELGESTROMIN-ETH ESTRADIOL 150-35 MCG/24HR TD PTWK: MEDICATED_PATCH | TRANSDERMAL | 1 refills | Status: DC

## 2023-10-20 MED ORDER — METRONIDAZOLE 500 MG PO TABS: 500.0000 mg | ORAL_TABLET | Freq: Two times a day (BID) | ORAL | 0 refills | Status: DC

## 2023-10-20 MED ORDER — ADDYI 100 MG PO TABS
1.0000 | ORAL_TABLET | Freq: Every evening | ORAL | 2 refills | Status: AC
Start: 2023-10-20 — End: ?

## 2023-10-20 NOTE — Progress Notes (Signed)
 Acute Office Visit  Subjective:    Patient ID: Casey Kirby, female    DOB: 27-Jul-2000, 23 y.o.   MRN: [id number]   HPI 23 y.o. G1P0010 presents today to discuss bleeding and low libido. H/O irregular periods. Stopped using patches for surgery in late 2022 and became pregnant, had miscarriage 2023. Reports another provider recommended she use patches continuously but she had bleeding x 6 months using this way. Just restarted the patches a few weeks ago. Has not had bleeding since. Considering alternatives. Normal TSH, no anemia from labs yesterday. Reports being told she has low estrogen in the past.   Complains of low sex drive. Has never had the desire for sexual encounters or masturbating. Does enjoy sex when in the act, does not orgasm. A friend of her takes Addyi and she would like to try it.   Feels like she has been getting yeast infections and/or UTIs due to constant bleeding.   Patient's last menstrual period was 10/12/2023 (exact date).    Review of Systems  Constitutional: Negative.   Genitourinary:  Positive for menstrual problem and vaginal pain (Irritation). Negative for dysuria, flank pain, frequency, pelvic pain, urgency and vaginal discharge.       Objective:    Physical Exam Constitutional:      Appearance: Normal appearance.  Genitourinary:    General: Normal vulva.     Vagina: Vaginal discharge present. No erythema.     Cervix: Normal.     Uterus: Normal.      Adnexa: Right adnexa normal and left adnexa normal.     BP 102/68   Pulse 64   LMP 10/12/2023 (Exact Date)   SpO2 99%  Wt Readings from Last 3 Encounters:  10/19/23 104 lb 4 oz (47.3 kg)  02/05/23 102 lb 11.2 oz (46.6 kg)  02/04/23 107 lb (48.5 kg)        Patient informed chaperone available to be present for breast and/or pelvic exam. Patient has requested no chaperone to be present. Patient has been advised what will be completed during breast and pelvic exam.   UPT neg Wet prep + clue  cells (+ odor) UA: trace leukocytes, neg nitrites, 2+ blood, 1+ protein, dark yellow/cloudy, Microscopic: wbc 6-10, rbc 3-10, moderate bacteria  Assessment & Plan:   Problem List Items Addressed This Visit       Other   Irregular menses   Relevant Medications   norelgestromin-ethinyl estradiol [employer]ico) 150-35 MCG/24HR transdermal patch   Other Relevant Orders   Pregnancy, urine   Other Visit Diagnoses       General counseling and advice on female contraception    -  Primary     Urinary frequency       Relevant Orders   Urinalysis,Complete w/RFL Culture     Hypoactive sexual desire disorder       Relevant Medications   Flibanserin (ADDYI) 100 MG TABS     Vaginal irritation       Relevant Orders   WET PREP FOR TRICH, YEAST, CLUE     Encounter for surveillance of transdermal patch hormonal contraceptive device       Relevant Medications   norelgestromin-ethinyl estradiol [employer]ico) 150-35 MCG/24HR transdermal patch     Bacterial vaginosis       Relevant Medications   metroNIDAZOLE (FLAGYL) 500 MG tablet      Plan: Contraceptive options were reviewed, including hormonal methods, both combination (pill, patch, vaginal ring) and progesterone-only (pill, Depo Provera and Nexplanon), intrauterine devices (  Tommy Medal, and Paraguard), Phexxi, barrier methods (condoms, diaphragm) and female/female sterilization. The mechanisms, risks, benefits and side effects of all methods were discussed. Wants to continue patches. Just restarted so she will give it more time to see how bleeding does.   Neg UPT. Wet prep positive for clue cells - Flagyl 100 mg BID x 7 days.   Addyi- discussed proper use, potential side effects, and avoidance of taking with alcohol use. Discontinue if no improvement after 8 weeks.   Return in about 3 months (around 01/19/2024) for Med follow up.    Olivia Mackie DNP, 9:33 AM 10/20/2023

## 2023-10-22 LAB — URINALYSIS, COMPLETE W/RFL CULTURE
Glucose, UA: NEGATIVE
Hyaline Cast: NONE SEEN /LPF
Nitrites, Initial: NEGATIVE
Specific Gravity, Urine: 1.025 (ref 1.001–1.035)
pH: 5.5 (ref 5.0–8.0)

## 2023-10-22 LAB — URINE CULTURE
MICRO NUMBER:: 16313733
Result:: NO GROWTH
SPECIMEN QUALITY:: ADEQUATE

## 2023-10-22 LAB — PREGNANCY, URINE: Preg Test, Ur: NEGATIVE

## 2023-10-22 LAB — CULTURE INDICATED

## 2023-10-24 ENCOUNTER — Encounter: Payer: Self-pay | Admitting: Nurse Practitioner

## 2023-10-24 ENCOUNTER — Encounter: Payer: Self-pay | Admitting: Family

## 2023-10-24 ENCOUNTER — Telehealth: Payer: Self-pay

## 2023-10-24 NOTE — Telephone Encounter (Signed)
 PA required for pt per pharmacy.  Initiated through [employer]: [id number]

## 2023-10-25 NOTE — Telephone Encounter (Signed)
 Initial PA request sent for authorization.

## 2023-10-25 NOTE — Telephone Encounter (Signed)
 FYI.  Encounter closed.

## 2023-11-03 ENCOUNTER — Encounter: Payer: Self-pay | Admitting: Obstetrics and Gynecology

## 2023-11-03 ENCOUNTER — Other Ambulatory Visit

## 2023-11-03 ENCOUNTER — Telehealth: Payer: Self-pay

## 2023-11-03 ENCOUNTER — Other Ambulatory Visit: Payer: Self-pay

## 2023-11-03 DIAGNOSIS — B9689 Other specified bacterial agents as the cause of diseases classified elsewhere: Secondary | ICD-10-CM

## 2023-11-03 DIAGNOSIS — R300 Dysuria: Secondary | ICD-10-CM

## 2023-11-03 DIAGNOSIS — N390 Urinary tract infection, site not specified: Secondary | ICD-10-CM

## 2023-11-03 DIAGNOSIS — R350 Frequency of micturition: Secondary | ICD-10-CM

## 2023-11-03 MED ORDER — NITROFURANTOIN MONOHYD MACRO 100 MG PO CAPS
100.0000 mg | ORAL_CAPSULE | Freq: Two times a day (BID) | ORAL | 0 refills | Status: DC
Start: 2023-11-03 — End: 2024-04-25

## 2023-11-03 MED ORDER — CLINDESSE 2 % VA CREA
1.0000 | TOPICAL_CREAM | Freq: Once | VAGINAL | 0 refills | Status: AC
Start: 2023-11-03 — End: 2023-11-03

## 2023-11-03 NOTE — Telephone Encounter (Signed)
 Msg sent to appt desk by pt:  "Comments: MyChart glitched, sorry if there's two of these requests- I couldn't complete my BV medication due to vomiting (continued taking it while vomiting but it worsened, couldn't take it 2 days from completion).  I've experienced another cycle since then and have the same symptoms.  I travel out of state this weekend, and am worried about a UTI/BV so if I could stop by to check before then that would be amazing."

## 2023-11-03 NOTE — Telephone Encounter (Signed)
 Pt coming in this AM to leave sample and rx sent to pharmacy.  Routing to provider for final review and encounter closed.

## 2023-11-03 NOTE — Telephone Encounter (Signed)
 Per EB: "Let's do the clindess gel and mark flagyl  in allergies. Can she come by to drop a urine sample off?"

## 2023-11-05 LAB — URINALYSIS, COMPLETE W/RFL CULTURE
Bilirubin Urine: NEGATIVE
Casts: NONE SEEN /LPF
Crystals: NONE SEEN /HPF
Glucose, UA: NEGATIVE
Nitrites, Initial: POSITIVE — AB
Specific Gravity, Urine: 1.025 (ref 1.001–1.035)
Yeast: NONE SEEN /HPF
pH: 5.5 (ref 5.0–8.0)

## 2023-11-05 LAB — URINE CULTURE
MICRO NUMBER:: 16370488
Result:: NO GROWTH
SPECIMEN QUALITY:: ADEQUATE

## 2023-11-05 LAB — CULTURE INDICATED

## 2023-11-11 NOTE — Telephone Encounter (Signed)
 Mychart msg returned unread.  Attempt to reach pt by phone:  No answer and mailbox is full.

## 2023-12-13 ENCOUNTER — Telehealth: Payer: Self-pay
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
## 2023-12-22 ENCOUNTER — Other Ambulatory Visit: Payer: Self-pay | Admitting: Nurse Practitioner

## 2023-12-22 DIAGNOSIS — F520 Hypoactive sexual desire disorder: Secondary | ICD-10-CM

## 2023-12-22 NOTE — Telephone Encounter (Signed)
 Med refill request: Addyi  Last OV: 10/20/23 Last AEX: none Next AEX: none Next OV: 01/18/24 Last MMG (if hormonal med) n/a Refill authorized: Addyi .   Please approve or deny as appropriate.

## 2024-01-18 ENCOUNTER — Ambulatory Visit: Admitting: Nurse Practitioner

## 2024-02-01 ENCOUNTER — Encounter: Payer: Self-pay | Admitting: Nurse Practitioner

## 2024-02-01 ENCOUNTER — Ambulatory Visit (INDEPENDENT_AMBULATORY_CARE_PROVIDER_SITE_OTHER): Admitting: Nurse Practitioner

## 2024-02-01 VITALS — BP 118/76 | HR 83 | Wt 112.0 lb

## 2024-02-01 DIAGNOSIS — R350 Frequency of micturition: Secondary | ICD-10-CM | POA: Diagnosis not present

## 2024-02-01 DIAGNOSIS — M6289 Other specified disorders of muscle: Secondary | ICD-10-CM | POA: Diagnosis not present

## 2024-02-01 DIAGNOSIS — Z3045 Encounter for surveillance of transdermal patch hormonal contraceptive device: Secondary | ICD-10-CM | POA: Diagnosis not present

## 2024-02-01 DIAGNOSIS — R6882 Decreased libido: Secondary | ICD-10-CM | POA: Diagnosis not present

## 2024-02-01 DIAGNOSIS — B3731 Acute candidiasis of vulva and vagina: Secondary | ICD-10-CM

## 2024-02-01 DIAGNOSIS — N926 Irregular menstruation, unspecified: Secondary | ICD-10-CM

## 2024-02-01 MED ORDER — FLUCONAZOLE 150 MG PO TABS: 150.0000 mg | ORAL_TABLET | ORAL | 0 refills | Status: DC

## 2024-02-01 MED ORDER — NORELGESTROMIN-ETH ESTRADIOL 150-35 MCG/24HR TD PTWK
MEDICATED_PATCH | TRANSDERMAL | 1 refills | Status: AC
Start: 2024-02-01 — End: ?
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
## 2024-02-02 LAB — URINALYSIS, COMPLETE W/RFL CULTURE
Glucose, UA: NEGATIVE
Hyaline Cast: NONE SEEN /LPF
Ketones, ur: NEGATIVE
Nitrites, Initial: NEGATIVE
Specific Gravity, Urine: 1.029 (ref 1.001–1.035)
pH: 5.5 (ref 5.0–8.0)

## 2024-02-02 LAB — URINE CULTURE
MICRO NUMBER:: 16735509
Result:: NO GROWTH
SPECIMEN QUALITY:: ADEQUATE

## 2024-02-02 LAB — CULTURE INDICATED

## 2024-03-07 ENCOUNTER — Ambulatory Visit (INDEPENDENT_AMBULATORY_CARE_PROVIDER_SITE_OTHER)

## 2024-03-07 ENCOUNTER — Other Ambulatory Visit: Payer: Self-pay | Admitting: Podiatry

## 2024-03-07 ENCOUNTER — Ambulatory Visit (INDEPENDENT_AMBULATORY_CARE_PROVIDER_SITE_OTHER): Admitting: Podiatry

## 2024-03-07 DIAGNOSIS — M2012 Hallux valgus (acquired), left foot: Secondary | ICD-10-CM

## 2024-03-07 DIAGNOSIS — Q666 Other congenital valgus deformities of feet: Secondary | ICD-10-CM

## 2024-03-07 DIAGNOSIS — M21619 Bunion of unspecified foot: Secondary | ICD-10-CM

## 2024-03-07 DIAGNOSIS — M21611 Bunion of right foot: Secondary | ICD-10-CM

## 2024-03-07 DIAGNOSIS — M2011 Hallux valgus (acquired), right foot: Secondary | ICD-10-CM

## 2024-03-07 DIAGNOSIS — L600 Ingrowing nail: Secondary | ICD-10-CM

## 2024-03-07 DIAGNOSIS — Z01818 Encounter for other preprocedural examination: Secondary | ICD-10-CM | POA: Diagnosis not present

## 2024-03-07 NOTE — Progress Notes (Unsigned)
 Subjective:  Patient ID: Casey Kirby, female    DOB: Apr 11, 2001,  MRN: [id number]  Chief Complaint  Patient presents with   Bunions    Pt stated that she had surgery on her right foot years ago  She would like to talk about surgery for her left foot     23 y.o. female presents with the above complaint.  Patient presents with primary complaint of left severe bunion deformity she states that she has tried shoe gear modification padding offloading all conservative care she has a history of bunion surgery done on the right foot.  She would like to do the left side now.  She also has pain to the right hallux with hallux interphalangeus pain on palpation.  She states the toe is curving or causing some discomfort.  She also has tertiary complaint bilateral hallux medial border ingrown.  She currently does not wear any orthotics denies any other acute issues.  Review of Systems: Negative except as noted in the HPI. Denies N/V/F/Ch.  Past Medical History:  Diagnosis Date   ADD (attention deficit disorder)    Depression     Current Outpatient Medications:    nystatin (MYCOSTATIN) 100000 UNIT/ML suspension, TAKE 2.5 ML ON EACH SIDE OF MOUTH (5 ML TOTAL) 4 TIMES A Diodato FOR 7 DAYS USE FOR 48H AFTER RESOLVED, Disp: , Rfl:    ADDYI  100 MG TABS, Take 1 tablet by mouth at bedtime., Disp: , Rfl:    clindamycin (CLEOCIN T) 1 % lotion, Apply topically., Disp: , Rfl:    doxycycline  (VIBRAMYCIN ) 100 MG capsule, Take 100 mg by mouth daily., Disp: , Rfl:    fluconazole  (DIFLUCAN ) 150 MG tablet, Take 1 tablet (150 mg total) by mouth every 3 (three) days., Disp: 2 tablet, Rfl: 0   metroNIDAZOLE  (FLAGYL ) 500 MG tablet, Take 1 tablet (500 mg total) by mouth 2 (two) times daily., Disp: 14 tablet, Rfl: 0   nitrofurantoin , macrocrystal-monohydrate, (MACROBID ) 100 MG capsule, Take 1 capsule (100 mg total) by mouth 2 (two) times daily., Disp: 14 capsule, Rfl: 0   norelgestromin -ethinyl estradiol  (XULANE ) 150-35 MCG/24HR  transdermal patch, Xulane  150 mcg-35 mcg/24 hr transdermal patch  APPLY 1 PATCH EVERY WEEK BY TRANSDERMAL ROUTE AS DIRECTED., Disp: 9 patch, Rfl: 1  Social History   Tobacco Use  Smoking Status Never  Smokeless Tobacco Never    Allergies  Allergen Reactions   Flagyl  [Metronidazole ] Nausea And Vomiting   Objective:  There were no vitals filed for this visit. There is no height or weight on file to calculate BMI. Constitutional Well developed. Well nourished.  Vascular Dorsalis pedis pulses palpable bilaterally. Posterior tibial pulses palpable bilaterally. Capillary refill normal to all digits.  No cyanosis or clubbing noted. Pedal hair growth normal.  Neurologic Normal speech. Oriented to person, place, and time. Epicritic sensation to light touch grossly present bilaterally.  Dermatologic Nails well groomed and normal in appearance. No open wounds. No skin lesions.  Orthopedic: Normal joint ROM without pain or crepitus bilaterally. Hallux abductovalgus deformity present left foot this is a tried cane not a fragment deformity no intra-articular first MPJ pain noted.  Previous bunion surgery noted on the right side.  No first MPJ crepitus. Left 1st MPJ diminished range of motion. Left 1st TMT with gross hypermobility. Right 1st MPJ diminished range of motion  Right 1st TMT without gross hypermobility. Lesser digital contractures absent bilaterally.  Bilateral hallux medial border ingrown painful to touch noted pes planovalgus foot structure noted   Radiographs:  Taken and reviewed. Hallux abductovalgus deformity present. Metatarsal parabola normal. 1st/2nd IMA: Severe; TSP: 6 out of 7  Assessment:   1. Bunion   2. Encounter for preoperative examination for general surgical procedure   3. Acquired hallux interphalangeus of right foot   4. Ingrown toenail of right foot   5. Ingrown left big toenail   6. Pes planovalgus    Plan:  Patient was evaluated and treated and  all questions answered.  Hallux abductovalgus deformity, left and right hallux interphalangeus -XR as above. -Patient has failed all conservative therapy and wishes to proceed with surgical intervention. All risks, benefits, and alternatives discussed with patient. No guarantees given. Consent reviewed and signed by patient. Post-op course explained at length. -Planned procedures: Left Lapidus correction with Lapa plasty with possible phalangeal osteotomy and right hallux interphalangeus and bilateral hallux medial border ingrown -Risk factors: None - I discussed my preoperative or postop plan with the patient extensive detail given that she is having so much from bunion deformity she has failed all conservative care including shoe gear modification padding protecting offloading she would benefit from surgical intervention I discussed this with her in extensive detail she states understand like to proceed with surgery she would benefit from left Lapidus bunionectomy with phalangeal osteotomy right Aiken osteotomy and bilateral hallux medial border ingrown removal -Informed surgical risk consent was reviewed and read aloud to the patient.  I reviewed the films.  I have discussed my findings with the patient in great detail.  I have discussed all risks including but not limited to infection, stiffness, scarring, limp, disability, deformity, damage to blood vessels and nerves, numbness, poor healing, need for braces, arthritis, chronic pain, amputation, death.  All benefits and realistic expectations discussed in great detail.  I have made no promises as to the outcome.  I have provided realistic expectations.  I have offered the patient a 2nd opinion, which they have declined and assured me they preferred to proceed despite the risks  Pes planovalgus -I explained to patient the etiology of pes planovalgus and relationship with Planter fasciitis and various treatment options were discussed.  Given patient  foot structure in the setting of Planter fasciitis I believe patient will benefit from custom-made orthotics to help control the hindfoot motion support the arch of the foot and take the stress away from plantar fascial.  Patient agrees with the plan like to proceed with orthotics -Patient was casted for orthotics

## 2024-03-07 NOTE — Progress Notes (Unsigned)
 Orthotics   Patient was present and evaluated for Custom molded foot orthotics. Patient will benefit from CFO's to provide total contact to BIL MLA's helping to balance and distribute body weight more evenly across BIL feet helping to reduce plantar pressure and pain. Orthotic will also encourage FF / RF alignment  Patient was scanned today and will return for fitting upon receipt

## 2024-03-13 ENCOUNTER — Other Ambulatory Visit: Payer: Self-pay | Admitting: Radiology

## 2024-03-13 NOTE — Telephone Encounter (Signed)
 Med refill request: Addyi   Last AEX: last OV 02/01/24 Next AEX: not scheduled  Last MMG (if hormonal med) Refill authorized: Rx refused, original prescription discontinued by: Prentiss Annabella LABOR, NP on 02/01/2024 reason: Patient stated medication wasn't working. Routing to provider for review.

## 2024-04-09 ENCOUNTER — Telehealth: Payer: Self-pay | Admitting: Podiatry

## 2024-04-09 NOTE — Telephone Encounter (Signed)
 Pt left message asking for a call back about her surgery. She knows the Hoe but not the time.   I returned call and left message for pt that the surgery center will call with a time 24 to 48 hours prior to her surgery to give her the time she needs to arrive.

## 2024-04-09 NOTE — Telephone Encounter (Signed)
 DOS- 05/14/2024  AIKEN OSTEOTOMY BIL- 71689 [place] PROCEDURE INCLUDING BUNIONECTOMY LT- 28297 EXC NAIL PERMANENT (HALLUX) MEDIAL WITH PHENOL BIL- 88249  [employer] EFFECTIVE DATE- 07/13/2023  FAMILY DEDUCTIBLE- $3600 REMAINING- $0 FAMILY OOP- $7200 REMAINING- $2802.36 COINSURANCE- 20%  PER [employer] PORTAL, PRIOR AUTH FOR CPT CODES [phone] (2 UNITS), 28297, AND 11750 (2 UNITS) HAVE BEEN APPROVED FROM 05/14/2024-08/12/2024. AUTH #[id number]

## 2024-04-16 ENCOUNTER — Ambulatory Visit: Attending: Nurse Practitioner | Admitting: Physical Therapy

## 2024-04-16 ENCOUNTER — Encounter: Payer: Self-pay | Admitting: Physical Therapy

## 2024-04-16 DIAGNOSIS — M6289 Other specified disorders of muscle: Secondary | ICD-10-CM | POA: Diagnosis not present

## 2024-04-16 DIAGNOSIS — R279 Unspecified lack of coordination: Secondary | ICD-10-CM | POA: Insufficient documentation

## 2024-04-16 DIAGNOSIS — M6281 Muscle weakness (generalized): Secondary | ICD-10-CM | POA: Insufficient documentation

## 2024-04-16 DIAGNOSIS — M62838 Other muscle spasm: Secondary | ICD-10-CM | POA: Diagnosis present
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
## 2024-04-16 NOTE — Patient Instructions (Signed)
[email or web address]   

## 2024-04-19 ENCOUNTER — Ambulatory Visit

## 2024-04-19 DIAGNOSIS — M6281 Muscle weakness (generalized): Secondary | ICD-10-CM

## 2024-04-19 DIAGNOSIS — M62838 Other muscle spasm: Secondary | ICD-10-CM

## 2024-04-19 DIAGNOSIS — R279 Unspecified lack of coordination: Secondary | ICD-10-CM

## 2024-04-19 NOTE — Patient Instructions (Signed)
   Urge Drill  When you feel an urge to go, follow these steps to regain control: Stop what you are doing and be still Take one deep breath, directing your air into your abdomen Think an affirming thought, such as "I've got this." Do 5 quick flicks of your pelvic floor Walk with control to the bathroom to void, or delay voiding  Double-voiding: This technique is to help with post-void dribbling, or leaking a little bit when you stand up right after urinating. Use relaxed toileting mechanics to urinate as much as you feel like you have to without straining. Sit back upright from leaning forward and relax this way for 10-20 seconds. Lean forward again to finish voiding any amount more.   Bladder/bowel retraining:  8-12 grams of fiber with each meal After each meal, go attempt to have a bowel movement within 30 minutes and don't sit on the toilet for more than 5 minutes Drink 4-6oz an hour Stop water intake 3 hours before bed Try to go at least 2-3 hours between trips to the bathroom - go whether you need to or not.  [facility] [address] [place], [place] [address] Phone # [phone] Fax [phone]
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
## 2024-04-23 ENCOUNTER — Ambulatory Visit: Admitting: Physical Therapy

## 2024-04-23 ENCOUNTER — Encounter: Payer: Self-pay | Admitting: Physical Therapy

## 2024-04-23 DIAGNOSIS — R279 Unspecified lack of coordination: Secondary | ICD-10-CM

## 2024-04-23 DIAGNOSIS — M6281 Muscle weakness (generalized): Secondary | ICD-10-CM | POA: Diagnosis not present

## 2024-04-23 DIAGNOSIS — M62838 Other muscle spasm: Secondary | ICD-10-CM
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
## 2024-04-25 ENCOUNTER — Encounter: Payer: Self-pay | Admitting: Family

## 2024-04-25 ENCOUNTER — Ambulatory Visit (INDEPENDENT_AMBULATORY_CARE_PROVIDER_SITE_OTHER): Admitting: Family

## 2024-04-25 VITALS — BP 94/58 | HR 89 | Temp 98.0°F | Ht 66.0 in | Wt 116.5 lb

## 2024-04-25 DIAGNOSIS — F845 Asperger's syndrome: Secondary | ICD-10-CM

## 2024-04-25 DIAGNOSIS — F4310 Post-traumatic stress disorder, unspecified: Secondary | ICD-10-CM | POA: Insufficient documentation

## 2024-04-25 DIAGNOSIS — J309 Allergic rhinitis, unspecified: Secondary | ICD-10-CM | POA: Insufficient documentation

## 2024-04-25 DIAGNOSIS — R0982 Postnasal drip: Secondary | ICD-10-CM

## 2024-04-25 DIAGNOSIS — Z91410 Personal history of adult physical and sexual abuse: Secondary | ICD-10-CM | POA: Insufficient documentation

## 2024-04-25 DIAGNOSIS — F5231 Female orgasmic disorder: Secondary | ICD-10-CM | POA: Diagnosis not present

## 2024-04-25 DIAGNOSIS — M79644 Pain in right finger(s): Secondary | ICD-10-CM | POA: Diagnosis not present

## 2024-04-25 MED ORDER — TRIAMCINOLONE ACETONIDE 55 MCG/ACT NA AERO: 1.0000 | INHALATION_SPRAY | Freq: Every day | NASAL | 2 refills | Status: AC
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
## 2024-04-25 NOTE — Patient Instructions (Addendum)
 It was very nice to see you today!   I have sent a referral to our Psychiatry & therapy team for counseling regarding your PTSD. But you have to call their office to schedule your first appointment for therapy. Call [facility] at [phone].   I also have sent a nasal spray, Nasacort to try for your postnasal drip and other sinus symptoms. Follow the instructions on the bottle. I also recommend using a saline nasal spray (store brand) 2-3 times per [place] to help moisturize your nasal passages.  Discuss with your gynecologist again your inability to have an orgasm.  Use over the counter lubricants to help with intercourse pain.  See the handout attached regarding your thumb pain and look for a thumb brace at the pharmacy or [facility] to help immobilize the thumb during the [place]. Apply ice for up to 20 minutes 2-3 times per Schar to help reduce inflammation. Also can apply over the counter Lidocaine cream for pain relief.   PLEASE NOTE:  If you had any lab tests please let us  know if you have not heard back within a few days. You may see your results on MyChart before we have a chance to review them but we will give you a call once they are reviewed by us . If we ordered any referrals today, please let us  know if you have not heard from their office within the next week.

## 2024-04-26 ENCOUNTER — Ambulatory Visit: Payer: Self-pay | Admitting: Physical Therapy

## 2024-04-26 DIAGNOSIS — M6281 Muscle weakness (generalized): Secondary | ICD-10-CM | POA: Diagnosis not present

## 2024-04-26 DIAGNOSIS — R279 Unspecified lack of coordination: Secondary | ICD-10-CM

## 2024-04-26 DIAGNOSIS — M62838 Other muscle spasm: Secondary | ICD-10-CM
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
## 2024-05-02 ENCOUNTER — Ambulatory Visit: Admitting: Physical Therapy

## 2024-05-02 DIAGNOSIS — M62838 Other muscle spasm: Secondary | ICD-10-CM

## 2024-05-02 DIAGNOSIS — R279 Unspecified lack of coordination: Secondary | ICD-10-CM

## 2024-05-02 DIAGNOSIS — M6281 Muscle weakness (generalized): Secondary | ICD-10-CM | POA: Diagnosis not present
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
## 2024-05-08 ENCOUNTER — Ambulatory Visit: Payer: Self-pay | Admitting: Physical Therapy

## 2024-05-08 ENCOUNTER — Encounter: Payer: Self-pay | Admitting: Physical Therapy

## 2024-05-08 DIAGNOSIS — R279 Unspecified lack of coordination: Secondary | ICD-10-CM

## 2024-05-08 DIAGNOSIS — M6281 Muscle weakness (generalized): Secondary | ICD-10-CM | POA: Diagnosis not present

## 2024-05-08 DIAGNOSIS — M62838 Other muscle spasm: Secondary | ICD-10-CM
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
## 2024-05-14 ENCOUNTER — Other Ambulatory Visit: Payer: Self-pay | Admitting: Podiatry

## 2024-05-14 DIAGNOSIS — M21611 Bunion of right foot: Secondary | ICD-10-CM | POA: Diagnosis not present

## 2024-05-14 DIAGNOSIS — M21612 Bunion of left foot: Secondary | ICD-10-CM | POA: Diagnosis not present

## 2024-05-14 DIAGNOSIS — M2012 Hallux valgus (acquired), left foot: Secondary | ICD-10-CM | POA: Diagnosis not present

## 2024-05-14 DIAGNOSIS — L600 Ingrowing nail: Secondary | ICD-10-CM | POA: Diagnosis not present

## 2024-05-14 MED ORDER — OXYCODONE-ACETAMINOPHEN 5-325 MG PO TABS: 1.0000 | ORAL_TABLET | ORAL | 0 refills | Status: DC | PRN

## 2024-05-14 MED ORDER — IBUPROFEN 800 MG PO TABS: 800.0000 mg | ORAL_TABLET | Freq: Four times a day (QID) | ORAL | 1 refills | Status: AC | PRN

## 2024-05-21 ENCOUNTER — Telehealth: Payer: Self-pay | Admitting: Podiatry

## 2024-05-21 NOTE — Telephone Encounter (Signed)
 Patients mother called back in regards to refill for oxycodone . States patient is completely out as of now and is experiencing pain.

## 2024-05-21 NOTE — Telephone Encounter (Signed)
 Patient requests a refill of oxycodone . She has one left.

## 2024-05-22 MED ORDER — OXYCODONE-ACETAMINOPHEN 5-325 MG PO TABS: 1.0000 | ORAL_TABLET | ORAL | 0 refills | Status: DC | PRN

## 2024-05-23 ENCOUNTER — Ambulatory Visit

## 2024-05-23 ENCOUNTER — Ambulatory Visit (INDEPENDENT_AMBULATORY_CARE_PROVIDER_SITE_OTHER): Admitting: Podiatry

## 2024-05-23 VITALS — BP 106/64 | HR 78

## 2024-05-23 DIAGNOSIS — M2012 Hallux valgus (acquired), left foot: Secondary | ICD-10-CM

## 2024-05-23 DIAGNOSIS — M2011 Hallux valgus (acquired), right foot: Secondary | ICD-10-CM | POA: Diagnosis not present

## 2024-05-23 DIAGNOSIS — L600 Ingrowing nail: Secondary | ICD-10-CM

## 2024-05-23 DIAGNOSIS — Z9889 Other specified postprocedural states: Secondary | ICD-10-CM

## 2024-05-23 MED ORDER — GABAPENTIN 300 MG PO CAPS: 300.0000 mg | ORAL_CAPSULE | Freq: Three times a day (TID) | ORAL | 3 refills | Status: AC

## 2024-05-23 NOTE — Progress Notes (Signed)
 Patient presents for post-op visit today, POV # 1 DOS 11/3 -LT LAPIDUIS BUNIONECTOMY W/ BIL HALLUS PHALANGEAL OSTEOTOMY W/ FIXATION, BIL MEDIAL INGROWN HALLUS W/ PHENOL MATRIXECTOMY.  RN Notes: Patient states she is taking pain meds regularly. She has been elevating most of the [place]. She does have twinges of pain occasionally. She has been having some constipation.   Vital Signs: BP-106/84 P-78    Radiographs: [x]  Taken []  Not taken  Surgical Site Assessment:  - Dressing:  [x]  Minimal dry blood, intact []  Reinforced   []  Changed     -RN Notes:   - Incision:  [x]  CDI (clean, dry, intact)  []  Mild erythema  []  Drainage noted   -RN Notes:   - Swelling:  []  None  [x]  Mild  []  Moderate   []  Significant     -RN Notes:   - Bruising:  []  None  [x]  Present: minimal   - Sutures/Staples:  []  None [x]  Intact  []  Removed Today  []  Plan to remove at next visit   -Cast/Splint/Pins: [x]  None []  Intact []  Removed Today []  Plan to remove at next visit []  Replaced  -Signs of infection:  [x]  None  []  Present - Describe:   -DME:    []  None [x]  AFW [x]  Surgical shoe []  Cast  []  Splint  -Walking status:  []  Full WB  []  Partial WB  [x]  NWB  -Utilizing device:  []  None []  Knee Scooter []  Crutches [x]  Wheelchair    DVT assessment:  [x]  Denies symptoms []  Chest pain/SOB []  Pain in calf/redness/warmth  Advised patient to try a stool softener to help with constipation.   Redressed DSD and ace wrap. Educated on signs of infection, proper dressing care, pain management, and weight bearing status. Patient will contact provider with any new or worsening symptoms. The provider assessed the patient today and reviewed instructions regarding plan of care.

## 2024-05-25 ENCOUNTER — Telehealth: Payer: Self-pay | Admitting: Podiatry

## 2024-05-25 NOTE — Telephone Encounter (Signed)
 Patient reports that you mentioned her left foot looks "strange," possibly due to pressure from the boot. She would like to know if you were planning to prescribe an antibiotic. Please advise and if so send to the [facility] pharmacy. Thank you.

## 2024-05-28 ENCOUNTER — Other Ambulatory Visit: Payer: Self-pay | Admitting: Podiatry

## 2024-05-28 MED ORDER — DOXYCYCLINE HYCLATE 100 MG PO TABS: 100.0000 mg | ORAL_TABLET | Freq: Two times a day (BID) | ORAL | 0 refills | Status: DC

## 2024-05-28 NOTE — Telephone Encounter (Signed)
 Notified the patient, and she stated, "Thank you so much."

## 2024-05-29 ENCOUNTER — Encounter

## 2024-06-01 ENCOUNTER — Encounter: Payer: Self-pay | Admitting: Podiatry

## 2024-06-01 ENCOUNTER — Other Ambulatory Visit: Payer: Self-pay | Admitting: Podiatry

## 2024-06-01 MED ORDER — OXYCODONE-ACETAMINOPHEN 5-325 MG PO TABS: 1.0000 | ORAL_TABLET | ORAL | 0 refills | Status: DC | PRN

## 2024-06-05 ENCOUNTER — Encounter: Admitting: Physical Therapy

## 2024-06-06 ENCOUNTER — Other Ambulatory Visit: Payer: Self-pay | Admitting: Podiatry

## 2024-06-06 ENCOUNTER — Ambulatory Visit (INDEPENDENT_AMBULATORY_CARE_PROVIDER_SITE_OTHER): Admitting: Podiatry

## 2024-06-06 DIAGNOSIS — M2011 Hallux valgus (acquired), right foot: Secondary | ICD-10-CM

## 2024-06-06 DIAGNOSIS — M2012 Hallux valgus (acquired), left foot: Secondary | ICD-10-CM

## 2024-06-06 DIAGNOSIS — L600 Ingrowing nail: Secondary | ICD-10-CM

## 2024-06-06 MED ORDER — OXYCODONE-ACETAMINOPHEN 5-325 MG PO TABS: 1.0000 | ORAL_TABLET | ORAL | 0 refills | Status: DC | PRN

## 2024-06-06 NOTE — Progress Notes (Signed)
 Patient presents for post-op visit today, POV # 2 DOS 11/3 -LT LAPIDUIS BUNIONECTOMY W/ BIL HALLUS PHALANGEAL OSTEOTOMY W/ FIXATION, BIL MEDIAL INGROWN HALLUS W/ PHENOL MATRIXECTOMY  Doing okay. I have been taking oxy less often up until yesterday. The pain increased in the ball of my left foot and around the heel. Right foot has been pretty okay. Pretty itchy, gabapentin  for that. Noticing itching even when taking that. I have been getting nauseous after the doxycycline  as well..  RN Notes: Patient's parents are in room with her today. Patient reports elevating her foot, staying off of it as much as she is able to.   Vital Signs: Today's Vitals   06/06/24 1118  PainSc: 4   PainLoc: Ankle      Radiographs: []  Taken [x]  Not taken  Left foot Surgical Site Assessment:  - Dressing:  [x]  Minimal dry blood, intact []  Reinforced   []  Changed     -RN Notes: n/a  - Incision:  [x]  CDI (clean, dry, intact)  [x]  Mild erythema  []  Drainage noted   -RN Notes: n/a  - Swelling:  []  None  [x]  Mild  []  Moderate   []  Significant     -RN Notes: n/a  - Bruising:  []  None  [x]  Present: medial aspect foot.    - Sutures/Staples:  []  None []  Intact  []  Removed Today  []  Plan to remove at next visit     -Absorbable sutures used.  -Cast/Splint/Pins: [x]  None []  Intact []  Removed Today []  Plan to remove at next visit []  Replaced  -Signs of infection:  [x]  None  []  Present - Describe: n/a  -DME:    []  None [x]  AFW []  Surgical shoe []  Cast  []  Splint  -Walking status:  []  Full WB  []  Partial WB  [x]  NWB  -Utilizing device:  []  None []  Knee Scooter []  Crutches [x]  Wheelchair   Right Foot Surgical Site Assessment:  - Dressing:  [x]  Minimal dry blood, intact []  Reinforced   []  Changed     -RN Notes: n/a  - Incision:  [x]  CDI (clean, dry, intact)  []  Mild erythema  []  Drainage noted   -RN Notes: n/a  - Swelling:  []  None  [x]  Mild  []  Moderate   []  Significant     -RN Notes: n/a  - Bruising:   []  None  [x]  Present: dorsal aspect, base of 1st toe.    - Sutures/Staples:  []  None []  Intact  []  Removed Today  []  Plan to remove at next visit     Absorbable sutures used.  -Cast/Splint/Pins: [x]  None []  Intact []  Removed Today []  Plan to remove at next visit []  Replaced  -Signs of infection:  [x]  None  []  Present - Describe: n/a  -DME:    []  None []  AFW [x]  Surgical shoe []  Cast  []  Splint  -Walking status:  []  Full WB  [x]  Partial WB  []  NWB  -Utilizing device:  []  None []  Knee Scooter []  Crutches [x]  Wheelchair    DVT assessment:  [x]  Denies symptoms []  Chest pain/SOB []  Pain in calf/redness/warmth   Redressed DSD and ace wrap. Educated on signs of infection, proper dressing care, pain management, and weight bearing status. Patient will contact provider with any new or worsening symptoms. The provider assessed the patient today and reviewed instructions regarding plan of care.

## 2024-06-12 ENCOUNTER — Encounter: Payer: Self-pay | Admitting: Podiatry

## 2024-06-14 ENCOUNTER — Other Ambulatory Visit: Payer: Self-pay | Admitting: Podiatry

## 2024-06-14 DIAGNOSIS — M2012 Hallux valgus (acquired), left foot: Secondary | ICD-10-CM

## 2024-06-14 DIAGNOSIS — M2011 Hallux valgus (acquired), right foot: Secondary | ICD-10-CM

## 2024-06-15 ENCOUNTER — Ambulatory Visit: Attending: Nurse Practitioner | Admitting: Physical Therapy

## 2024-06-15 ENCOUNTER — Encounter: Payer: Self-pay | Admitting: Physical Therapy

## 2024-06-15 ENCOUNTER — Other Ambulatory Visit: Payer: Self-pay

## 2024-06-15 DIAGNOSIS — R262 Difficulty in walking, not elsewhere classified: Secondary | ICD-10-CM | POA: Insufficient documentation

## 2024-06-15 DIAGNOSIS — R279 Unspecified lack of coordination: Secondary | ICD-10-CM | POA: Insufficient documentation

## 2024-06-15 DIAGNOSIS — M79675 Pain in left toe(s): Secondary | ICD-10-CM | POA: Diagnosis present

## 2024-06-15 DIAGNOSIS — M25672 Stiffness of left ankle, not elsewhere classified: Secondary | ICD-10-CM | POA: Insufficient documentation

## 2024-06-15 DIAGNOSIS — M2011 Hallux valgus (acquired), right foot: Secondary | ICD-10-CM | POA: Insufficient documentation

## 2024-06-15 DIAGNOSIS — M2012 Hallux valgus (acquired), left foot: Secondary | ICD-10-CM | POA: Diagnosis present

## 2024-06-15 DIAGNOSIS — M62838 Other muscle spasm: Secondary | ICD-10-CM | POA: Insufficient documentation

## 2024-06-15 DIAGNOSIS — M25671 Stiffness of right ankle, not elsewhere classified: Secondary | ICD-10-CM | POA: Insufficient documentation

## 2024-06-15 DIAGNOSIS — M6281 Muscle weakness (generalized): Secondary | ICD-10-CM | POA: Insufficient documentation

## 2024-06-15 DIAGNOSIS — M79671 Pain in right foot: Secondary | ICD-10-CM | POA: Diagnosis present
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
## 2024-06-19 ENCOUNTER — Ambulatory Visit

## 2024-06-19 DIAGNOSIS — M6281 Muscle weakness (generalized): Secondary | ICD-10-CM

## 2024-06-19 DIAGNOSIS — R279 Unspecified lack of coordination: Secondary | ICD-10-CM

## 2024-06-19 DIAGNOSIS — M62838 Other muscle spasm: Secondary | ICD-10-CM

## 2024-06-19 DIAGNOSIS — M2011 Hallux valgus (acquired), right foot: Secondary | ICD-10-CM | POA: Diagnosis not present
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
## 2024-06-21 ENCOUNTER — Ambulatory Visit

## 2024-06-21 DIAGNOSIS — M25672 Stiffness of left ankle, not elsewhere classified: Secondary | ICD-10-CM

## 2024-06-21 DIAGNOSIS — M2011 Hallux valgus (acquired), right foot: Secondary | ICD-10-CM | POA: Diagnosis not present

## 2024-06-21 DIAGNOSIS — R262 Difficulty in walking, not elsewhere classified: Secondary | ICD-10-CM

## 2024-06-21 DIAGNOSIS — M25671 Stiffness of right ankle, not elsewhere classified: Secondary | ICD-10-CM

## 2024-06-21 DIAGNOSIS — M79675 Pain in left toe(s): Secondary | ICD-10-CM

## 2024-06-21 DIAGNOSIS — M6281 Muscle weakness (generalized): Secondary | ICD-10-CM

## 2024-06-21 DIAGNOSIS — M79671 Pain in right foot: Secondary | ICD-10-CM
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
## 2024-06-25 ENCOUNTER — Ambulatory Visit: Admitting: Physical Therapy

## 2024-06-25 ENCOUNTER — Telehealth: Payer: Self-pay

## 2024-06-25 ENCOUNTER — Encounter: Payer: Self-pay | Admitting: Physical Therapy

## 2024-06-25 DIAGNOSIS — M79671 Pain in right foot: Secondary | ICD-10-CM

## 2024-06-25 DIAGNOSIS — M25671 Stiffness of right ankle, not elsewhere classified: Secondary | ICD-10-CM

## 2024-06-25 DIAGNOSIS — M2011 Hallux valgus (acquired), right foot: Secondary | ICD-10-CM | POA: Diagnosis not present

## 2024-06-25 DIAGNOSIS — M79675 Pain in left toe(s): Secondary | ICD-10-CM

## 2024-06-25 DIAGNOSIS — R262 Difficulty in walking, not elsewhere classified: Secondary | ICD-10-CM

## 2024-06-25 DIAGNOSIS — M6281 Muscle weakness (generalized): Secondary | ICD-10-CM

## 2024-06-25 DIAGNOSIS — M25672 Stiffness of left ankle, not elsewhere classified: Secondary | ICD-10-CM
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
## 2024-06-25 NOTE — Telephone Encounter (Signed)
Patients orthotics are in .  

## 2024-06-27 ENCOUNTER — Ambulatory Visit: Admitting: Physical Therapy

## 2024-06-27 ENCOUNTER — Encounter: Payer: Self-pay | Admitting: Nurse Practitioner

## 2024-06-27 ENCOUNTER — Encounter: Payer: Self-pay | Admitting: Physical Therapy

## 2024-06-27 ENCOUNTER — Ambulatory Visit: Admitting: Nurse Practitioner

## 2024-06-27 VITALS — BP 98/60 | HR 74 | Resp 16

## 2024-06-27 DIAGNOSIS — N760 Acute vaginitis: Secondary | ICD-10-CM

## 2024-06-27 DIAGNOSIS — M79675 Pain in left toe(s): Secondary | ICD-10-CM

## 2024-06-27 DIAGNOSIS — R262 Difficulty in walking, not elsewhere classified: Secondary | ICD-10-CM

## 2024-06-27 DIAGNOSIS — R350 Frequency of micturition: Secondary | ICD-10-CM

## 2024-06-27 DIAGNOSIS — B9689 Other specified bacterial agents as the cause of diseases classified elsewhere: Secondary | ICD-10-CM

## 2024-06-27 DIAGNOSIS — M2011 Hallux valgus (acquired), right foot: Secondary | ICD-10-CM | POA: Diagnosis not present

## 2024-06-27 DIAGNOSIS — N898 Other specified noninflammatory disorders of vagina: Secondary | ICD-10-CM

## 2024-06-27 DIAGNOSIS — M6281 Muscle weakness (generalized): Secondary | ICD-10-CM

## 2024-06-27 DIAGNOSIS — M79671 Pain in right foot: Secondary | ICD-10-CM

## 2024-06-27 LAB — WET PREP FOR TRICH, YEAST, CLUE

## 2024-06-27 MED ORDER — METRONIDAZOLE 0.75 % VA GEL: 1.0000 | Freq: Every day | VAGINAL | 0 refills | Status: AC

## 2024-06-27 NOTE — Progress Notes (Signed)
° °  Acute Office Visit  Subjective:    Patient ID: Casey Kirby, female    DOB: 04-27-2001, 23 y.o.   MRN: [id number]   HPI 23 y.o. presents today for vaginal discharge with odor and urinary frequency x 2 weeks. No new sexual partners. Declines STD screening. Had surgery in November and was on Doxycycline .   Patient's last menstrual period was 06/06/2024 (exact date). Period Duration (Days): 9 Period Pattern: Regular Menstrual Flow: Heavy Menstrual Control:  (menstrual cup)  Review of Systems  Constitutional: Negative.   Genitourinary:  Positive for frequency and vaginal discharge. Negative for dysuria, flank pain, hematuria, pelvic pain, urgency and vaginal pain.       + odor       Objective:    Physical Exam Exam conducted with a chaperone present.  Constitutional:      Appearance: Normal appearance.  Genitourinary:    General: Normal vulva.     Vagina: Vaginal discharge present. No erythema.     Cervix: Normal.     BP 98/60   Pulse 74   Resp 16   LMP 06/06/2024 (Exact Date)  Wt Readings from Last 3 Encounters:  04/25/24 116 lb 8 oz (52.8 kg)  02/01/24 112 lb (50.8 kg)  10/19/23 104 lb 4 oz (47.3 kg)        Casey Kirby, CMA present as biomedical engineer.   UA: 1+ leukocytes, neg nitrites, 1+ protein, trace blood, yellow/cloudy. Microscopic: wbc 10-20, rbc 3-10, moderate bacteria  Wet prep + clue cells (+ odor)   Assessment & Plan:   Problem List Items Addressed This Visit   None Visit Diagnoses       Bacterial vaginosis    -  Primary   Relevant Medications   metroNIDAZOLE  (METROGEL ) 0.75 % vaginal gel     Urinary frequency       Relevant Orders   Urinalysis,Complete w/RFL Culture     Vaginal discharge       Relevant Orders   WET PREP FOR TRICH, YEAST, CLUE      Plan: Metrogel  nightly x 5 days. Mild leukocytosis in urine is likely from vaginal infection. Will wait on culture.    Return if symptoms worsen or fail to improve.    Casey DELENA Shutter DNP,  12:42 PM 06/27/2024
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
## 2024-06-28 ENCOUNTER — Ambulatory Visit: Admitting: Physical Therapy

## 2024-06-28 DIAGNOSIS — M6281 Muscle weakness (generalized): Secondary | ICD-10-CM

## 2024-06-28 DIAGNOSIS — R279 Unspecified lack of coordination: Secondary | ICD-10-CM

## 2024-06-28 DIAGNOSIS — M62838 Other muscle spasm: Secondary | ICD-10-CM

## 2024-06-28 DIAGNOSIS — M2011 Hallux valgus (acquired), right foot: Secondary | ICD-10-CM | POA: Diagnosis not present
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
## 2024-06-29 ENCOUNTER — Ambulatory Visit: Payer: Self-pay | Admitting: Nurse Practitioner

## 2024-06-29 LAB — URINALYSIS, COMPLETE W/RFL CULTURE
Bilirubin Urine: NEGATIVE
Casts: NONE SEEN /LPF
Crystals: NONE SEEN /HPF
Glucose, UA: NEGATIVE
Ketones, ur: NEGATIVE
Nitrites, Initial: NEGATIVE
Specific Gravity, Urine: 1.025 (ref 1.001–1.035)
Yeast: NONE SEEN /HPF
pH: 5.5 (ref 5.0–8.0)

## 2024-06-29 LAB — URINE CULTURE
MICRO NUMBER:: 17367709
Result:: NO GROWTH
SPECIMEN QUALITY:: ADEQUATE

## 2024-06-29 LAB — CULTURE INDICATED

## 2024-06-30 ENCOUNTER — Encounter: Payer: Self-pay | Admitting: Nurse Practitioner

## 2024-07-02 ENCOUNTER — Ambulatory Visit: Admitting: Physical Therapy

## 2024-07-02 ENCOUNTER — Encounter: Payer: Self-pay | Admitting: Physical Therapy

## 2024-07-02 DIAGNOSIS — R279 Unspecified lack of coordination: Secondary | ICD-10-CM

## 2024-07-02 DIAGNOSIS — M6281 Muscle weakness (generalized): Secondary | ICD-10-CM

## 2024-07-02 DIAGNOSIS — M2011 Hallux valgus (acquired), right foot: Secondary | ICD-10-CM | POA: Diagnosis not present

## 2024-07-02 DIAGNOSIS — M79675 Pain in left toe(s): Secondary | ICD-10-CM

## 2024-07-02 DIAGNOSIS — R262 Difficulty in walking, not elsewhere classified: Secondary | ICD-10-CM

## 2024-07-02 DIAGNOSIS — M79671 Pain in right foot: Secondary | ICD-10-CM

## 2024-07-02 DIAGNOSIS — M62838 Other muscle spasm: Secondary | ICD-10-CM
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
## 2024-07-03 ENCOUNTER — Other Ambulatory Visit

## 2024-07-03 ENCOUNTER — Ambulatory Visit

## 2024-07-04 ENCOUNTER — Ambulatory Visit

## 2024-07-04 ENCOUNTER — Ambulatory Visit (INDEPENDENT_AMBULATORY_CARE_PROVIDER_SITE_OTHER): Admitting: Podiatry

## 2024-07-04 DIAGNOSIS — M79672 Pain in left foot: Secondary | ICD-10-CM | POA: Diagnosis not present

## 2024-07-04 MED ORDER — SANTYL 250 UNIT/GM EX OINT: 1.0000 | TOPICAL_OINTMENT | Freq: Every day | CUTANEOUS | 0 refills | Status: AC

## 2024-07-04 NOTE — Progress Notes (Signed)
"  °  Subjective:  Patient ID: Casey Kirby, female    DOB: September 08, 2000,  MRN: [id number]  Chief Complaint  Patient presents with   Bunions    DOS: 05/14/2024 Procedure: Left Lapidus bunionectomy with bilateral phalangeal osteotomy with bilateral total medial border ingrown's  23 y.o. female returns for post-op check.  She states she is doing okay her pain is controlled.  She is worried about a scab formation to the left foot at the Lapidus plates.  Denies any other acute complaints been keeping it covered.  Review of Systems: Negative except as noted in the HPI. Denies N/V/F/Ch.  Past Medical History:  Diagnosis Date   ADD (attention deficit disorder)    Depression    Current Medications[1]  Tobacco Use History[2]  Allergies[3] Objective:  There were no vitals filed for this visit. There is no height or weight on file to calculate BMI. Constitutional Well developed. Well nourished.  Vascular Foot warm and well perfused. Capillary refill normal to all digits.   Neurologic Normal speech. Oriented to person, place, and time. Epicritic sensation to light touch grossly present bilaterally.  Dermatologic Incision completely reepithelialized superficial dehiscence noted at the proximal part of the Lapidus incision on the left foot.  Does not probe down to deep tissue does not exposed hardware.  Scab formation noted  Orthopedic: Tenderness to palpation noted about the surgical site.   Radiographs: 3 views of skeletally mature left foot good correction alignment noted reduction of deformity noted hardware is intact no signs of backing out or loosening noted. Assessment:   1. Left foot pain    Plan:  Patient was evaluated and treated and all questions answered.  S/p foot surgery left -Progressing as expected post-operatively. -XR: See above -WB Status: Weightbearing as tolerated in regular shoes -Sutures: None. -Medications: Santyl  wet-to-dry - Courage patient to do Santyl  ointment  to the scab formation to help heal it quickly.  Patient is in agreement I will see her back again in next couple of weeks to evaluate incision site  No follow-ups on file.     [1]  Current Outpatient Medications:    collagenase  (SANTYL ) 250 UNIT/GM ointment, Apply 1 Application topically daily. 1 cm x 0.5 cm x 0.5 cm, Disp: 15 g, Rfl: 0   gabapentin  (NEURONTIN ) 300 MG capsule, Take 1 capsule (300 mg total) by mouth 3 (three) times daily., Disp: 90 capsule, Rfl: 3   ibuprofen  (ADVIL ) 800 MG tablet, Take 1 tablet (800 mg total) by mouth every 6 (six) hours as needed., Disp: 60 tablet, Rfl: 1   norelgestromin -ethinyl estradiol  (XULANE ) 150-35 MCG/24HR transdermal patch, Xulane  150 mcg-35 mcg/24 hr transdermal patch  APPLY 1 PATCH EVERY WEEK BY TRANSDERMAL ROUTE AS DIRECTED., Disp: 9 patch, Rfl: 1   triamcinolone  (NASACORT ) 55 MCG/ACT AERO nasal inhaler, Place 1 spray into the nose daily. Start with 1 spray each side twice a Gugel for 3 days, then reduce to daily., Disp: 1 each, Rfl: 2 [2]  Social History Tobacco Use  Smoking Status Never  Smokeless Tobacco Never  [3]  Allergies Allergen Reactions   Flagyl  [Metronidazole ] Nausea And Vomiting   "

## 2024-07-07 ENCOUNTER — Encounter: Payer: Self-pay | Admitting: Podiatry

## 2024-07-09 ENCOUNTER — Ambulatory Visit

## 2024-07-09 ENCOUNTER — Other Ambulatory Visit: Payer: Self-pay | Admitting: Podiatry

## 2024-07-09 DIAGNOSIS — M2011 Hallux valgus (acquired), right foot: Secondary | ICD-10-CM | POA: Diagnosis not present

## 2024-07-09 DIAGNOSIS — M62838 Other muscle spasm: Secondary | ICD-10-CM

## 2024-07-09 DIAGNOSIS — M79675 Pain in left toe(s): Secondary | ICD-10-CM

## 2024-07-09 DIAGNOSIS — M25671 Stiffness of right ankle, not elsewhere classified: Secondary | ICD-10-CM

## 2024-07-09 DIAGNOSIS — M25672 Stiffness of left ankle, not elsewhere classified: Secondary | ICD-10-CM

## 2024-07-09 DIAGNOSIS — M6281 Muscle weakness (generalized): Secondary | ICD-10-CM

## 2024-07-09 DIAGNOSIS — M79671 Pain in right foot: Secondary | ICD-10-CM

## 2024-07-09 DIAGNOSIS — R262 Difficulty in walking, not elsewhere classified: Secondary | ICD-10-CM
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
## 2024-07-11 ENCOUNTER — Ambulatory Visit

## 2024-07-11 DIAGNOSIS — M25672 Stiffness of left ankle, not elsewhere classified: Secondary | ICD-10-CM

## 2024-07-11 DIAGNOSIS — R262 Difficulty in walking, not elsewhere classified: Secondary | ICD-10-CM

## 2024-07-11 DIAGNOSIS — M25671 Stiffness of right ankle, not elsewhere classified: Secondary | ICD-10-CM

## 2024-07-11 DIAGNOSIS — M2011 Hallux valgus (acquired), right foot: Secondary | ICD-10-CM | POA: Diagnosis not present

## 2024-07-11 DIAGNOSIS — M62838 Other muscle spasm: Secondary | ICD-10-CM

## 2024-07-11 DIAGNOSIS — M79675 Pain in left toe(s): Secondary | ICD-10-CM

## 2024-07-11 DIAGNOSIS — M79671 Pain in right foot: Secondary | ICD-10-CM

## 2024-07-11 DIAGNOSIS — M6281 Muscle weakness (generalized): Secondary | ICD-10-CM
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
## 2024-07-16 ENCOUNTER — Encounter: Payer: Self-pay | Admitting: Physical Therapy

## 2024-07-16 ENCOUNTER — Ambulatory Visit: Attending: Nurse Practitioner | Admitting: Physical Therapy

## 2024-07-16 DIAGNOSIS — M79671 Pain in right foot: Secondary | ICD-10-CM | POA: Insufficient documentation

## 2024-07-16 DIAGNOSIS — M25672 Stiffness of left ankle, not elsewhere classified: Secondary | ICD-10-CM | POA: Insufficient documentation

## 2024-07-16 DIAGNOSIS — R279 Unspecified lack of coordination: Secondary | ICD-10-CM | POA: Diagnosis present

## 2024-07-16 DIAGNOSIS — M25671 Stiffness of right ankle, not elsewhere classified: Secondary | ICD-10-CM | POA: Insufficient documentation

## 2024-07-16 DIAGNOSIS — R262 Difficulty in walking, not elsewhere classified: Secondary | ICD-10-CM | POA: Diagnosis present

## 2024-07-16 DIAGNOSIS — M6281 Muscle weakness (generalized): Secondary | ICD-10-CM | POA: Insufficient documentation

## 2024-07-16 DIAGNOSIS — M2012 Hallux valgus (acquired), left foot: Secondary | ICD-10-CM | POA: Diagnosis present

## 2024-07-16 DIAGNOSIS — M2011 Hallux valgus (acquired), right foot: Secondary | ICD-10-CM | POA: Diagnosis present

## 2024-07-16 DIAGNOSIS — M62838 Other muscle spasm: Secondary | ICD-10-CM | POA: Diagnosis present

## 2024-07-16 DIAGNOSIS — M79675 Pain in left toe(s): Secondary | ICD-10-CM | POA: Diagnosis present
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
## 2024-07-17 ENCOUNTER — Ambulatory Visit: Attending: Nurse Practitioner

## 2024-07-17 DIAGNOSIS — R279 Unspecified lack of coordination: Secondary | ICD-10-CM

## 2024-07-17 DIAGNOSIS — R262 Difficulty in walking, not elsewhere classified: Secondary | ICD-10-CM | POA: Insufficient documentation

## 2024-07-17 DIAGNOSIS — M2011 Hallux valgus (acquired), right foot: Secondary | ICD-10-CM | POA: Insufficient documentation

## 2024-07-17 DIAGNOSIS — M79671 Pain in right foot: Secondary | ICD-10-CM | POA: Insufficient documentation

## 2024-07-17 DIAGNOSIS — M6281 Muscle weakness (generalized): Secondary | ICD-10-CM | POA: Diagnosis not present

## 2024-07-17 DIAGNOSIS — M62838 Other muscle spasm: Secondary | ICD-10-CM

## 2024-07-17 DIAGNOSIS — M2012 Hallux valgus (acquired), left foot: Secondary | ICD-10-CM | POA: Insufficient documentation

## 2024-07-17 DIAGNOSIS — M79672 Pain in left foot: Secondary | ICD-10-CM | POA: Insufficient documentation
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
## 2024-07-18 ENCOUNTER — Ambulatory Visit

## 2024-07-18 DIAGNOSIS — M79671 Pain in right foot: Secondary | ICD-10-CM

## 2024-07-18 DIAGNOSIS — M2011 Hallux valgus (acquired), right foot: Secondary | ICD-10-CM

## 2024-07-18 DIAGNOSIS — M6281 Muscle weakness (generalized): Secondary | ICD-10-CM | POA: Diagnosis not present

## 2024-07-18 DIAGNOSIS — M79675 Pain in left toe(s): Secondary | ICD-10-CM

## 2024-07-18 DIAGNOSIS — M25671 Stiffness of right ankle, not elsewhere classified: Secondary | ICD-10-CM

## 2024-07-18 DIAGNOSIS — M25672 Stiffness of left ankle, not elsewhere classified: Secondary | ICD-10-CM
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
## 2024-07-23 ENCOUNTER — Ambulatory Visit: Admitting: Physical Therapy

## 2024-07-23 ENCOUNTER — Encounter: Payer: Self-pay | Admitting: Physical Therapy

## 2024-07-23 DIAGNOSIS — M25671 Stiffness of right ankle, not elsewhere classified: Secondary | ICD-10-CM

## 2024-07-23 DIAGNOSIS — M6281 Muscle weakness (generalized): Secondary | ICD-10-CM | POA: Diagnosis not present

## 2024-07-23 DIAGNOSIS — M79671 Pain in right foot: Secondary | ICD-10-CM

## 2024-07-23 DIAGNOSIS — M25672 Stiffness of left ankle, not elsewhere classified: Secondary | ICD-10-CM

## 2024-07-23 DIAGNOSIS — M79675 Pain in left toe(s): Secondary | ICD-10-CM
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
## 2024-07-25 ENCOUNTER — Ambulatory Visit

## 2024-07-25 DIAGNOSIS — M25672 Stiffness of left ankle, not elsewhere classified: Secondary | ICD-10-CM

## 2024-07-25 DIAGNOSIS — M2011 Hallux valgus (acquired), right foot: Secondary | ICD-10-CM

## 2024-07-25 DIAGNOSIS — M6281 Muscle weakness (generalized): Secondary | ICD-10-CM

## 2024-07-25 DIAGNOSIS — M25671 Stiffness of right ankle, not elsewhere classified: Secondary | ICD-10-CM

## 2024-07-25 DIAGNOSIS — M62838 Other muscle spasm: Secondary | ICD-10-CM

## 2024-07-25 DIAGNOSIS — R279 Unspecified lack of coordination: Secondary | ICD-10-CM

## 2024-07-25 DIAGNOSIS — M79675 Pain in left toe(s): Secondary | ICD-10-CM

## 2024-07-25 DIAGNOSIS — M79671 Pain in right foot: Secondary | ICD-10-CM

## 2024-07-25 DIAGNOSIS — M2012 Hallux valgus (acquired), left foot: Secondary | ICD-10-CM
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
## 2024-07-30 ENCOUNTER — Ambulatory Visit: Admitting: Physical Therapy

## 2024-07-30 ENCOUNTER — Encounter: Payer: Self-pay | Admitting: Physical Therapy

## 2024-07-30 ENCOUNTER — Ambulatory Visit (HOSPITAL_COMMUNITY): Payer: Self-pay | Admitting: Student in an Organized Health Care Education/Training Program

## 2024-07-30 ENCOUNTER — Ambulatory Visit

## 2024-07-30 ENCOUNTER — Encounter (HOSPITAL_COMMUNITY): Payer: Self-pay | Admitting: Student in an Organized Health Care Education/Training Program

## 2024-07-30 VITALS — BP 106/71 | HR 74 | Ht 66.0 in | Wt 118.0 lb

## 2024-07-30 DIAGNOSIS — Z8659 Personal history of other mental and behavioral disorders: Secondary | ICD-10-CM | POA: Diagnosis not present

## 2024-07-30 DIAGNOSIS — R262 Difficulty in walking, not elsewhere classified: Secondary | ICD-10-CM | POA: Diagnosis not present

## 2024-07-30 DIAGNOSIS — F4310 Post-traumatic stress disorder, unspecified: Secondary | ICD-10-CM

## 2024-07-30 DIAGNOSIS — M25671 Stiffness of right ankle, not elsewhere classified: Secondary | ICD-10-CM

## 2024-07-30 DIAGNOSIS — R279 Unspecified lack of coordination: Secondary | ICD-10-CM

## 2024-07-30 DIAGNOSIS — M25672 Stiffness of left ankle, not elsewhere classified: Secondary | ICD-10-CM

## 2024-07-30 DIAGNOSIS — M2011 Hallux valgus (acquired), right foot: Secondary | ICD-10-CM | POA: Diagnosis present

## 2024-07-30 DIAGNOSIS — M6281 Muscle weakness (generalized): Secondary | ICD-10-CM | POA: Diagnosis not present

## 2024-07-30 DIAGNOSIS — M79671 Pain in right foot: Secondary | ICD-10-CM | POA: Diagnosis not present

## 2024-07-30 DIAGNOSIS — M79672 Pain in left foot: Secondary | ICD-10-CM | POA: Diagnosis not present

## 2024-07-30 DIAGNOSIS — M2012 Hallux valgus (acquired), left foot: Secondary | ICD-10-CM | POA: Diagnosis not present

## 2024-07-30 DIAGNOSIS — M62838 Other muscle spasm: Secondary | ICD-10-CM

## 2024-07-30 DIAGNOSIS — M79675 Pain in left toe(s): Secondary | ICD-10-CM

## 2024-07-30 MED ORDER — ESCITALOPRAM OXALATE 5 MG PO TABS: 5.0000 mg | ORAL_TABLET | Freq: Every day | ORAL | 1 refills | Status: AC
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
## 2024-07-31 NOTE — Addendum Note (Signed)
 Addended by: CARVIN CROCK on: 07/31/2024 12:13 PM   Modules accepted: Level of Service

## 2024-08-01 ENCOUNTER — Ambulatory Visit

## 2024-08-01 DIAGNOSIS — M62838 Other muscle spasm: Secondary | ICD-10-CM

## 2024-08-01 DIAGNOSIS — R279 Unspecified lack of coordination: Secondary | ICD-10-CM

## 2024-08-01 DIAGNOSIS — M6281 Muscle weakness (generalized): Secondary | ICD-10-CM | POA: Diagnosis not present
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
## 2024-08-06 ENCOUNTER — Encounter: Payer: Self-pay | Admitting: Physical Therapy

## 2024-08-07 ENCOUNTER — Ambulatory Visit

## 2024-08-07 DIAGNOSIS — M6281 Muscle weakness (generalized): Secondary | ICD-10-CM

## 2024-08-07 DIAGNOSIS — M25671 Stiffness of right ankle, not elsewhere classified: Secondary | ICD-10-CM

## 2024-08-07 DIAGNOSIS — M25672 Stiffness of left ankle, not elsewhere classified: Secondary | ICD-10-CM

## 2024-08-07 DIAGNOSIS — M79675 Pain in left toe(s): Secondary | ICD-10-CM

## 2024-08-07 DIAGNOSIS — R262 Difficulty in walking, not elsewhere classified: Secondary | ICD-10-CM

## 2024-08-07 DIAGNOSIS — M2012 Hallux valgus (acquired), left foot: Secondary | ICD-10-CM

## 2024-08-07 DIAGNOSIS — M79671 Pain in right foot: Secondary | ICD-10-CM

## 2024-08-07 DIAGNOSIS — R279 Unspecified lack of coordination: Secondary | ICD-10-CM

## 2024-08-07 DIAGNOSIS — M62838 Other muscle spasm: Secondary | ICD-10-CM
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
## 2024-08-08 ENCOUNTER — Encounter: Admitting: Podiatry

## 2024-08-14 ENCOUNTER — Ambulatory Visit

## 2024-08-14 DIAGNOSIS — M6281 Muscle weakness (generalized): Secondary | ICD-10-CM

## 2024-08-14 DIAGNOSIS — M79675 Pain in left toe(s): Secondary | ICD-10-CM

## 2024-08-14 DIAGNOSIS — M25672 Stiffness of left ankle, not elsewhere classified: Secondary | ICD-10-CM

## 2024-08-14 DIAGNOSIS — M79671 Pain in right foot: Secondary | ICD-10-CM

## 2024-08-14 DIAGNOSIS — M25671 Stiffness of right ankle, not elsewhere classified: Secondary | ICD-10-CM

## 2024-08-14 DIAGNOSIS — R262 Difficulty in walking, not elsewhere classified: Secondary | ICD-10-CM

## 2024-08-16 ENCOUNTER — Ambulatory Visit

## 2024-08-16 DIAGNOSIS — M79671 Pain in right foot: Secondary | ICD-10-CM

## 2024-08-16 DIAGNOSIS — M79675 Pain in left toe(s): Secondary | ICD-10-CM

## 2024-08-16 DIAGNOSIS — M25672 Stiffness of left ankle, not elsewhere classified: Secondary | ICD-10-CM

## 2024-08-16 DIAGNOSIS — M25671 Stiffness of right ankle, not elsewhere classified: Secondary | ICD-10-CM

## 2024-08-16 DIAGNOSIS — R262 Difficulty in walking, not elsewhere classified: Secondary | ICD-10-CM

## 2024-08-16 DIAGNOSIS — M6281 Muscle weakness (generalized): Secondary | ICD-10-CM
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
## 2024-08-17 ENCOUNTER — Ambulatory Visit: Admitting: Podiatry

## 2024-08-17 NOTE — Progress Notes (Unsigned)
 Little better wound conitnue santyl 

## 2024-08-22 ENCOUNTER — Ambulatory Visit

## 2024-08-28 ENCOUNTER — Ambulatory Visit: Admitting: Physical Therapy

## 2024-08-30 ENCOUNTER — Ambulatory Visit

## 2024-09-03 ENCOUNTER — Ambulatory Visit (HOSPITAL_COMMUNITY): Admitting: Student in an Organized Health Care Education/Training Program

## 2024-09-04 ENCOUNTER — Ambulatory Visit

## 2024-09-05 ENCOUNTER — Ambulatory Visit (HOSPITAL_COMMUNITY)

## 2024-09-06 ENCOUNTER — Ambulatory Visit

## 2024-09-19 ENCOUNTER — Ambulatory Visit: Admitting: Podiatry
